# Patient Record
Sex: Male | Born: 1960 | Race: White | Hispanic: No | Marital: Married | Smoking: Former smoker
Health system: Southern US, Community
[De-identification: ages and names within clinical notes are randomized; demographics above are authoritative.]

## PROBLEM LIST (undated history)

## (undated) DIAGNOSIS — N200 Calculus of kidney: Secondary | ICD-10-CM

## (undated) DIAGNOSIS — I7121 Aneurysm of the ascending aorta, without rupture: Secondary | ICD-10-CM

## (undated) DIAGNOSIS — R12 Heartburn: Secondary | ICD-10-CM

## (undated) DIAGNOSIS — I10 Essential (primary) hypertension: Secondary | ICD-10-CM

## (undated) DIAGNOSIS — R7989 Other specified abnormal findings of blood chemistry: Secondary | ICD-10-CM

## (undated) DIAGNOSIS — I719 Aortic aneurysm of unspecified site, without rupture: Secondary | ICD-10-CM

## (undated) DIAGNOSIS — T7840XA Allergy, unspecified, initial encounter: Secondary | ICD-10-CM

## (undated) DIAGNOSIS — U071 COVID-19: Secondary | ICD-10-CM

## (undated) DIAGNOSIS — G4730 Sleep apnea, unspecified: Secondary | ICD-10-CM

## (undated) DIAGNOSIS — G4733 Obstructive sleep apnea (adult) (pediatric): Secondary | ICD-10-CM

## (undated) HISTORY — DX: Sleep apnea, unspecified: G47.30

## (undated) HISTORY — DX: Other specified abnormal findings of blood chemistry: R79.89

## (undated) HISTORY — DX: Obstructive sleep apnea (adult) (pediatric): G47.33

## (undated) HISTORY — DX: Heartburn: R12

## (undated) HISTORY — DX: Calculus of kidney: N20.0

## (undated) HISTORY — DX: Allergy, unspecified, initial encounter: T78.40XA

## (undated) HISTORY — DX: COVID-19: U07.1

## (undated) HISTORY — DX: Essential (primary) hypertension: I10

---

## 2001-02-12 ENCOUNTER — Ambulatory Visit (HOSPITAL_COMMUNITY): Admission: RE | Admit: 2001-02-12 | Discharge: 2001-02-12 | Payer: Self-pay | Admitting: *Deleted

## 2006-09-24 ENCOUNTER — Emergency Department (HOSPITAL_COMMUNITY): Admission: EM | Admit: 2006-09-24 | Discharge: 2006-09-24 | Payer: Self-pay | Admitting: Emergency Medicine

## 2008-02-04 ENCOUNTER — Encounter (INDEPENDENT_AMBULATORY_CARE_PROVIDER_SITE_OTHER): Payer: Self-pay | Admitting: *Deleted

## 2008-02-15 ENCOUNTER — Emergency Department (HOSPITAL_COMMUNITY): Admission: EM | Admit: 2008-02-15 | Discharge: 2008-02-15 | Payer: Self-pay | Admitting: Emergency Medicine

## 2008-09-09 ENCOUNTER — Encounter: Payer: Self-pay | Admitting: Family Medicine

## 2008-09-27 ENCOUNTER — Emergency Department (HOSPITAL_COMMUNITY): Admission: EM | Admit: 2008-09-27 | Discharge: 2008-09-27 | Payer: Self-pay | Admitting: Family Medicine

## 2008-09-27 ENCOUNTER — Inpatient Hospital Stay (HOSPITAL_COMMUNITY): Admission: EM | Admit: 2008-09-27 | Discharge: 2008-09-28 | Payer: Self-pay | Admitting: Emergency Medicine

## 2009-01-23 ENCOUNTER — Ambulatory Visit: Payer: Self-pay | Admitting: Family Medicine

## 2009-01-23 DIAGNOSIS — Z87891 Personal history of nicotine dependence: Secondary | ICD-10-CM | POA: Insufficient documentation

## 2009-01-23 DIAGNOSIS — N200 Calculus of kidney: Secondary | ICD-10-CM | POA: Insufficient documentation

## 2009-01-23 DIAGNOSIS — L851 Acquired keratosis [keratoderma] palmaris et plantaris: Secondary | ICD-10-CM | POA: Insufficient documentation

## 2009-02-09 ENCOUNTER — Emergency Department (HOSPITAL_COMMUNITY): Admission: EM | Admit: 2009-02-09 | Discharge: 2009-02-09 | Payer: Self-pay | Admitting: Family Medicine

## 2009-02-14 ENCOUNTER — Emergency Department (HOSPITAL_COMMUNITY): Admission: EM | Admit: 2009-02-14 | Discharge: 2009-02-14 | Payer: Self-pay | Admitting: Family Medicine

## 2009-05-01 ENCOUNTER — Emergency Department (HOSPITAL_COMMUNITY): Admission: EM | Admit: 2009-05-01 | Discharge: 2009-05-01 | Payer: Self-pay | Admitting: Family Medicine

## 2009-08-16 ENCOUNTER — Encounter (INDEPENDENT_AMBULATORY_CARE_PROVIDER_SITE_OTHER): Payer: Self-pay | Admitting: *Deleted

## 2009-08-29 ENCOUNTER — Encounter (INDEPENDENT_AMBULATORY_CARE_PROVIDER_SITE_OTHER): Payer: Self-pay | Admitting: *Deleted

## 2009-08-30 ENCOUNTER — Ambulatory Visit: Payer: Self-pay | Admitting: Gastroenterology

## 2009-09-18 ENCOUNTER — Ambulatory Visit: Payer: Self-pay | Admitting: Gastroenterology

## 2009-09-18 HISTORY — PX: COLONOSCOPY: SHX174

## 2009-09-18 LAB — HM COLONOSCOPY

## 2009-11-09 ENCOUNTER — Ambulatory Visit: Payer: Self-pay | Admitting: Family Medicine

## 2009-11-09 LAB — CONVERTED CEMR LAB
ALT: 38 units/L (ref 0–53)
AST: 32 units/L (ref 0–37)
Albumin: 4 g/dL (ref 3.5–5.2)
Alkaline Phosphatase: 71 units/L (ref 39–117)
BUN: 11 mg/dL (ref 6–23)
Basophils Absolute: 0.1 10*3/uL (ref 0.0–0.1)
Basophils Relative: 0.6 % (ref 0.0–3.0)
Bilirubin, Direct: 0.2 mg/dL (ref 0.0–0.3)
CO2: 29 meq/L (ref 19–32)
Calcium: 9.9 mg/dL (ref 8.4–10.5)
Chloride: 103 meq/L (ref 96–112)
Cholesterol: 186 mg/dL (ref 0–200)
Creatinine, Ser: 1.1 mg/dL (ref 0.4–1.5)
Eosinophils Absolute: 0.4 10*3/uL (ref 0.0–0.7)
Eosinophils Relative: 4.3 % (ref 0.0–5.0)
GFR calc non Af Amer: 77.64 mL/min (ref >60)
Glucose, Bld: 67 mg/dL — ABNORMAL LOW (ref 70–99)
HCT: 43.9 % (ref 39.0–52.0)
HDL: 36.2 mg/dL — ABNORMAL LOW (ref >39.00)
Hemoglobin: 14.5 g/dL (ref 13.0–17.0)
LDL Cholesterol: 134 mg/dL — ABNORMAL HIGH (ref 0–99)
Lymphocytes Relative: 30.6 % (ref 12.0–46.0)
Lymphs Abs: 2.9 10*3/uL (ref 0.7–4.0)
MCHC: 33.1 g/dL (ref 30.0–36.0)
MCV: 77.6 fL — ABNORMAL LOW (ref 78.0–100.0)
Monocytes Absolute: 0.6 10*3/uL (ref 0.1–1.0)
Monocytes Relative: 6.3 % (ref 3.0–12.0)
Neutro Abs: 5.5 10*3/uL (ref 1.4–7.7)
Neutrophils Relative %: 58.2 % (ref 43.0–77.0)
PSA: 0.24 ng/mL (ref 0.10–4.00)
Platelets: 233 10*3/uL (ref 150.0–400.0)
Potassium: 4.3 meq/L (ref 3.5–5.1)
RBC: 5.66 M/uL (ref 4.22–5.81)
RDW: 16.4 % — ABNORMAL HIGH (ref 11.5–14.6)
Sodium: 139 meq/L (ref 135–145)
TSH: 2.17 microintl units/mL (ref 0.35–5.50)
Total Bilirubin: 1.2 mg/dL (ref 0.3–1.2)
Total CHOL/HDL Ratio: 5
Total Protein: 6.6 g/dL (ref 6.0–8.3)
Triglycerides: 79 mg/dL (ref 0.0–149.0)
VLDL: 15.8 mg/dL (ref 0.0–40.0)
WBC: 9.4 10*3/uL (ref 4.5–10.5)

## 2009-11-13 ENCOUNTER — Ambulatory Visit: Payer: Self-pay | Admitting: Family Medicine

## 2009-11-14 ENCOUNTER — Ambulatory Visit: Payer: Self-pay | Admitting: Family Medicine

## 2009-11-16 ENCOUNTER — Ambulatory Visit: Payer: Self-pay | Admitting: Family Medicine

## 2009-11-17 ENCOUNTER — Telehealth: Payer: Self-pay | Admitting: Family Medicine

## 2009-11-24 ENCOUNTER — Telehealth: Payer: Self-pay | Admitting: Family Medicine

## 2010-02-16 ENCOUNTER — Emergency Department (HOSPITAL_COMMUNITY): Admission: EM | Admit: 2010-02-16 | Discharge: 2010-02-16 | Payer: Self-pay | Admitting: Family Medicine

## 2010-04-26 NOTE — Progress Notes (Signed)
Summary: note & be sure no more med needed  Phone Note Call from Patient Call back at [phone]   Summary of Call: 1)Note needed Mon through Fri Aug 29 to Sept 2.  Will return to work 9-6. Fax note to [phone] Attn Haynes Dage.   2) Sores starting to dry up.  2weeks duration.  Want to double check that you don't want another round antibiotic.  Dr. B did tell me that med works 5 days beyond end of taking it.   Frustrating that sore not cleared up.  [facility] Initial call taken by: Rudy Jew, RN,  November 24, 2009 8:50 AM  Follow-up for Phone Call        OK to provide note as requested .  At this point no further antibiotics and I have discussed this with pt.  He will call if he feels he needs any further antibiotics by next week. Follow-up by: Evelena Peat MD,  November 24, 2009 8:59 AM  Additional Follow-up for Phone Call Additional follow up Details #1::        Phone Call Completed Additional Follow-up by: Rudy Jew, RN,  November 24, 2009 10:42 AM

## 2010-04-26 NOTE — Assessment & Plan Note (Signed)
Summary: CPX//CCM   Vital Signs:  Patient profile:   50 year old male Height:      69 inches Weight:      282 pounds BMI:     41.79 Temp:     98 degrees F oral Pulse rate:   72 / minute Pulse rhythm:   regular Resp:     12 per minute BP sitting:   140 / 90  (left arm) Cuff size:   large  Vitals Entered By: Sid Falcon LPN (November 16, 2009 10:21 AM)  Nutrition Counseling: Patient's BMI is greater than 25 and therefore counseled on weight management options. CC: CPX, labs done   History of Present Illness: Here for CPE.  Colonocsopy this summer normal. Still smoking but ready to quit. Not exercising.  Recent cough and wheeze improving with prednisone and antibiotics. Does have penile vesicluar rash following recent Doxycycline. No dysuria.  Preventive Screening-Counseling & Management  Alcohol-Tobacco     Smoking Status: current  Clinical Review Panels:  Prevention   Last Colonoscopy:  DONE (09/18/2009)   Last PSA:  0.24 (11/09/2009)  Lipid Management   Cholesterol:  186 (11/09/2009)   LDL (bad choesterol):  134 (11/09/2009)   HDL (good cholesterol):  36.20 (11/09/2009)  CBC   WBC:  9.4 (11/09/2009)   RBC:  5.66 (11/09/2009)   Hgb:  14.5 (11/09/2009)   Hct:  43.9 (11/09/2009)   Platelets:  233.0 (11/09/2009)   MCV  77.6 (11/09/2009)   MCHC  33.1 (11/09/2009)   RDW  16.4 (11/09/2009)   PMN:  58.2 (11/09/2009)   Lymphs:  30.6 (11/09/2009)   Monos:  6.3 (11/09/2009)   Eosinophils:  4.3 (11/09/2009)   Basophil:  0.6 (11/09/2009)  Complete Metabolic Panel   Glucose:  67 (11/09/2009)   Sodium:  139 (11/09/2009)   Potassium:  4.3 (11/09/2009)   Chloride:  103 (11/09/2009)   CO2:  29 (11/09/2009)   BUN:  11 (11/09/2009)   Creatinine:  1.1 (11/09/2009)   Albumin:  4.0 (11/09/2009)   Total Protein:  6.6 (11/09/2009)   Calcium:  9.9 (11/09/2009)   Total Bili:  1.2 (11/09/2009)   Alk Phos:  71 (11/09/2009)   SGPT (ALT):  38 (11/09/2009)   SGOT (AST):   32 (11/09/2009)   Allergies: 1)  Doxycycline Hyclate (Doxycycline Hyclate)  Past History:  Past Medical History: Last updated: 11/13/2009 Kidney stones X 2 2009, 2010 Obstructive Sleep Apnea.  Family History: Last updated: 01/23/2009 Family History Breast cancer 1st degree relative <50 Family history heart disease  Social History: Last updated: 01/23/2009 Occupation:   Drives concrete truck Married Current Smoker Alcohol use-no  Risk Factors: Smoking Status: current (11/16/2009) PMH-FH-SH reviewed for relevance  Review of Systems  The patient denies anorexia, fever, weight loss, vision loss, decreased hearing, hoarseness, chest pain, syncope, dyspnea on exertion, peripheral edema, prolonged cough, headaches, hemoptysis, abdominal pain, melena, hematochezia, severe indigestion/heartburn, hematuria, incontinence, muscle weakness, depression, and enlarged lymph nodes.    Physical Exam  General:  Well-developed,well-nourished,in no acute distress; alert,appropriate and cooperative throughout examination Head:  Normocephalic and atraumatic without obvious abnormalities. No apparent alopecia or balding. Eyes:  No corneal or conjunctival inflammation noted. EOMI. Perrla. Funduscopic exam benign, without hemorrhages, exudates or papilledema. Vision grossly normal. Ears:  External ear exam shows no significant lesions or deformities.  Otoscopic examination reveals clear canals, tympanic membranes are intact bilaterally without bulging, retraction, inflammation or discharge. Hearing is grossly normal bilaterally. Mouth:  Oral mucosa and oropharynx without lesions or exudates.  Teeth in good repair. Neck:  No deformities, masses, or tenderness noted. Lungs:  lungs are much clearer with resolution of wheezing from couple days ago. No rales Heart:  Normal rate and regular rhythm. S1 and S2 normal without gallop, murmur, click, rub or other extra sounds. Abdomen:  Bowel sounds  positive,abdomen soft and non-tender without masses, organomegaly or hernias noted. Genitalia:  erythema shaft and foreskin of penis.  HAs some large vesicles which are drying.  These are diffuse-almost more bullous and not typical of herpetic lesions.  No penile discharge. Extremities:  No clubbing, cyanosis, edema, or deformity noted with normal full range of motion of all joints.   Neurologic:  alert & oriented X3, cranial nerves II-XII intact, strength normal in all extremities, and gait normal.   Skin:  patient has several blistery rash involving shaft of penis. No pustules. Cervical Nodes:  No lymphadenopathy noted Psych:  normally interactive, good eye contact, not anxious appearing, and not depressed appearing.     Impression & Recommendations:  Problem # 1:  Preventive Health Care (ICD-V70.0) discussed smoking cessation,  weight loss. Tdap.  Labs reviewed.  Complete Medication List: 1)  Azithromycin 250 Mg Tabs (Azithromycin) .... 2 by mouth today then one by mouth once daily for 4 days. 2)  Ventolin Hfa 108 (90 Base) Mcg/act Aers (Albuterol sulfate) .... 2 puffs every 4 hours as needed cough and wheeze 3)  Prednisone 10 Mg Tabs (Prednisone) .... Taper as follows: 5-5-4-4-3-3-2-1  Other Orders: Tobacco use cessation intermediate 3-10 minutes (99406) Tdap => 58yrs IM (40981) Admin 1st Vaccine (19147)  Patient Instructions: 1)  Stop smoking tips: Choose a quit date. Cut down before the quit date. Decide what you will do as a substitute when you feel the urge to smoke(gum, toothpick, exercise).  2)  It is important that you exercise reguarly at least 20 minutes 5 times a week. If you develop chest pain, have severe difficulty breathing, or feel very tired, stop exercising immediately and seek medical attention.  3)  You need to lose weight. Consider a lower calorie diet and regular exercise.  4)  Check your  Blood Pressure regularly . If it is above:140/90   you should make an  appointment. 5)  Please schedule a follow-up appointment in 4 months .    Immunizations Administered:  Tetanus Vaccine:    Vaccine Type: Tdap    Site: left deltoid    Mfr: GlaxoSmithKline    Dose: 0.5 ml    Route: IM    Given by: Sid Falcon LPN    Exp. Date: 12/14/2011    Lot #: [id number]

## 2010-04-26 NOTE — Procedures (Signed)
Summary: Colonoscopy  Patient: [name] Note: All result statuses are Final unless otherwise noted.  Tests: (1) Colonoscopy (COL)   COL Colonoscopy           DONE     [facility]     [address]     [place], [place]  [address]           COLONOSCOPY PROCEDURE REPORT           PATIENT:  Antonio Watts, Antonio Watts  MR#:  [id number]     BIRTHDATE:  1960-10-06, 49 yrs. old  GENDER:  male     ENDOSCOPIST:  Judie Petit T. Russella Dar, MD, [facility]     Referred by:  Evelena Peat, M.D.     PROCEDURE DATE:  09/18/2009     PROCEDURE:  Colonoscopy 30160     ASA CLASS:  Class II     INDICATIONS:  1) Routine Risk Screening     MEDICATIONS:   Fentanyl 50 mcg IV, Versed 8 mg IV     DESCRIPTION OF PROCEDURE:   After the risks benefits and     alternatives of the procedure were thoroughly explained, informed     consent was obtained.  Digital rectal exam was performed and     revealed no abnormalities.   The LB [id number] endoscope     was introduced through the anus and advanced to the cecum, which     was identified by both the appendix and ileocecal valve, without     limitations.  The quality of the prep was good, using MoviPrep.     The instrument was then slowly withdrawn as the colon was fully     examined.     <<PROCEDUREIMAGES>>     FINDINGS:  A normal appearing cecum, ileocecal valve, and     appendiceal orifice were identified. The ascending, hepatic     flexure, transverse, splenic flexure, descending, sigmoid colon,     and rectum appeared unremarkable. Retroflexed views in the rectum     revealed no abnormalities.  The time to cecum =  3  minutes. The     scope was then withdrawn (time =  12.33  min) from the patient and     the procedure completed.           COMPLICATIONS:  None           ENDOSCOPIC IMPRESSION:     1) Normal colon           RECOMMENDATIONS:     1) Continue current colorectal screening recommendations for     "routine risk" patients with a repeat  colonoscopy in 10 years.           Venita Lick. Russella Dar, MD, [facility]           n.     eSIGNED:   Venita Lick. Stark at 09/18/2009 04:12 PM           [name], [id number]  Note: An exclamation mark (!) indicates a result that was not dispersed into the flowsheet. Document Creation Date: 09/18/2009 4:13 PM _______________________________________________________________________  (1) Order result status: Final Collection or observation date-time: 09/18/2009 16:10 Requested date-time:  Receipt date-time:  Reported date-time:  Referring Physician:   Ordering Physician: Claudette Head [phone]) Specimen Source:  Source: Launa Grill Order Number: [id number] Lab site:   Appended Document: Colonoscopy     Procedures Next Due Date:    Colonoscopy: 09/2019

## 2010-04-26 NOTE — Letter (Signed)
Summary: Previsit letter  [facility]  [address] [place], [place] [address]   Phone: [phone]  Fax: [phone]       08/16/2009 MRN: [id number]  Antonio Watts [address] [place], [place]  [address]  Dear Mr. ROLLO,  Welcome to the Gastroenterology Division at [facility].    You are scheduled to see a nurse for your pre-procedure visit on 08/30/2009 at 4:30PM on the 3rd floor at [facility], [address]  We ask that you try to arrive at our office 15 minutes prior to your appointment time to allow for check-in.  Your nurse visit will consist of discussing your medical and surgical history, your immediate family medical history, and your medications.    Please bring a complete list of all your medications or, if you prefer, bring the medication bottles and we will list them.  We will need to be aware of both prescribed and over the counter drugs.  We will need to know exact dosage information as well.  If you are on blood thinners (Coumadin, Plavix, Aggrenox, Ticlid, etc.) please call our office today/prior to your appointment, as we need to consult with your physician about holding your medication.   Please be prepared to read and sign documents such as consent forms, a financial agreement, and acknowledgement forms.  If necessary, and with your consent, a friend or relative is welcome to sit-in on the nurse visit with you.  Please bring your insurance card so that we may make a copy of it.  If your insurance requires a referral to see a specialist, please bring your referral form from your primary care physician.  No co-pay is required for this nurse visit.     If you cannot keep your appointment, please call [phone] to cancel or reschedule prior to your appointment date.  This allows [place] the opportunity to schedule an appointment for another patient in need of care.    Thank you for choosing [facility] for your medical  needs.  We appreciate the opportunity to care for you.  Please visit [place] at our website  to learn more about our practice.                     Sincerely.                                                                                                                   The Gastroenterology Division

## 2010-04-26 NOTE — Assessment & Plan Note (Signed)
Summary: BRAND NEW PT/TO ESTABLISH/CJR   Vital Signs:  Patient profile:   50 year old male Height:      69 inches Weight:      271 pounds BMI:     40.16 Temp:     98.6 degrees F oral Pulse rate:   80 / minute Pulse rhythm:   regular Resp:     12 per minute BP sitting:   160 / 100  (left arm) Cuff size:   large  Vitals Entered By: Sid Falcon LPN (January 23, 2009 3:14 PM)  Nutrition Counseling: Patient's BMI is greater than 25 and therefore counseled on weight management options.  Serial Vital Signs/Assessments:  Time      Position  BP       Pulse  Resp  Temp     By                     130/90                         Evelena Peat MD  CC: New pt to establish, mole on left temple area changing   History of Present Illness: New patient to establish care. Past medical history significant for kidney stones back in 2000 and 2010. Takes no medications. No known allergies.  He has a verrucous, hyperkeratotic lesion left temple region which has been slowly growing for several years. This was biopsied 10-12 years ago and normal. No bleeding or itching. Sometimes gets in the way with combing hair.  Patient smokes one pack of cigarettes per day. Desires to quit at this time. Has had some success with nicotine patches in the past.  Contemplating methods of quitting at this time.  Preventive Screening-Counseling & Management  Alcohol-Tobacco     Smoking Status: current  Allergies (verified): No Known Drug Allergies  Past History:  Family History: Last updated: 01/23/2009 Family History Breast cancer 1st degree relative <50 Family history heart disease  Social History: Last updated: 01/23/2009 Occupation:   Drives concrete truck Married Current Smoker Alcohol use-no  Risk Factors: Smoking Status: current (01/23/2009)  Past Medical History: Kidney stones X 2 2009, 2010  Family History: Family History Breast cancer 1st degree relative <50 Family history heart  disease  Social History: Occupation:   Dietitian truck Married Current Smoker Alcohol use-no Occupation:  employed Smoking Status:  current  Review of Systems  The patient denies anorexia, fever, weight loss, chest pain, prolonged cough, and abdominal pain.    Physical Exam  General:  Well-developed,well-nourished,in no acute distress; alert,appropriate and cooperative throughout examination Ears:  External ear exam shows no significant lesions or deformities.  Otoscopic examination reveals clear canals, tympanic membranes are intact bilaterally without bulging, retraction, inflammation or discharge. Hearing is grossly normal bilaterally. Mouth:  Oral mucosa and oropharynx without lesions or exudates.  Teeth in good repair. Neck:  No deformities, masses, or tenderness noted. Lungs:  Normal respiratory effort, chest expands symmetrically. Lungs are clear to auscultation, no crackles or wheezes. Heart:  Normal rate and regular rhythm. S1 and S2 normal without gallop, murmur, click, rub or other extra sounds. Skin:  well-demarcated brownish slightly raised lesion left temporal region which is approximately 7-8 mm in diameter   Impression & Recommendations:  Problem # 1:  KERATOSIS (ICD-701.1)  Verrucous keratosis left temporal region. Reviewed options for treatment. We recommended liquid nitrogen after reviewing risks and benefits the patient consented. Patient tolerated well. Follow  up  2-3 weeks if this is not resolving.  Orders: Cryotherapy/Destruction benign or premalignant lesion (1st lesion)  (17000)  Problem # 2:  NEPHROLITHIASIS (ICD-592.0) Assessment: Comment Only  Problem # 3:  PERS HX TOBACCO USE PRESENTING HAZARDS HEALTH (ICD-V15.82) Discussed options for smoking cessation.  Pt undecided at this point.  Patient Instructions: 1)  Consider scheduling complete physical examination within the next year at your convenience. 2)  Be in touch within 3-4 weeks if skin  lesion is not resolving

## 2010-04-26 NOTE — Assessment & Plan Note (Signed)
Summary: Allergic reaction?/cb   Vital Signs:  Patient profile:   50 year old male Temp:     98.2 degrees F oral BP sitting:   130 / 82  (left arm) Cuff size:   large  Vitals Entered By: Sid Falcon LPN (November 14, 2009 9:45 AM)  History of Present Illness: Same-day appointment. Possible allergic reaction. Started doxycycline yesterday. Last night had some swelling and itching of the hands and feet. No lip or tongue edema. Breathing is actually improved with less wheezing and less shortness of breath. He received Depo-Medrol yesterday. Did not fill prescription for Ventolin. Benadryl for itching last night which helped slightly. Denies any significant dyspnea today  Did have fever up to 103 last night but down today after taking some aspirin. No prior allergic reactions to aspirin.  His rash preceeded ASA use.  Allergies (verified): 1)  Doxycycline Hyclate (Doxycycline Hyclate)  Past History:  Past Medical History: Last updated: 11/13/2009 Kidney stones X 2 2009, 2010 Obstructive Sleep Apnea. PMH reviewed for relevance  Physical Exam  General:  Well-developed,well-nourished,in no acute distress; alert,appropriate and cooperative throughout examination Mouth:  Oral mucosa and oropharynx without lesions or exudates.  Teeth in good repair. Neck:  No deformities, masses, or tenderness noted. Lungs:  still has some diffuse wheezes but overall improved air movement. No rales Heart:  normal rate and regular rhythm.   Skin:  patient has some mild swelling and erythema involving the hands diffusely mostly palms with similar though lesser involvement of the feet. No generalized hives. Patient also has some erythema and swelling of the foreskin and involving the shaft/glans of the penis   Impression & Recommendations:  Problem # 1:  SKIN RASH, ALLERGIC (ICD-692.9) Stop Doxycyxline.  Switch to Z-max.  Add oral pred taper and cont benadryl. His updated medication list for this problem  includes:    Prednisone 10 Mg Tabs (Prednisone) .Marland Kitchen... Taper as follows: 5-5-4-4-3-3-2-1  Complete Medication List: 1)  Azithromycin 250 Mg Tabs (Azithromycin) .... 2 by mouth today then one by mouth once daily for 4 days. 2)  Ventolin Hfa 108 (90 Base) Mcg/act Aers (Albuterol sulfate) .... 2 puffs every 4 hours as needed cough and wheeze 3)  Prednisone 10 Mg Tabs (Prednisone) .... Taper as follows: 5-5-4-4-3-3-2-1  Patient Instructions: 1)  Stop doxycycline 2)  Continue Benadryl as needed for itching Prescriptions: PREDNISONE 10 MG TABS (PREDNISONE) taper as follows: 5-5-4-4-3-3-2-1  #27 x 0   Entered and Authorized by:   Evelena Peat MD   Signed by:   Evelena Peat MD on 11/14/2009   Method used:   [facility]  [address] [id number]       [address]       [place], [place]  [address]       Ph: [phone] or [phone]       Fax: [phone]   RxID:   [id number] AZITHROMYCIN 250 MG TABS (AZITHROMYCIN) 2 by mouth today then one by mouth once daily for 4 days.  #6 x 0   Entered and Authorized by:   Evelena Peat MD   Signed by:   Evelena Peat MD on 11/14/2009   Method used:   [facility]        [facility]  [address] [id number]       [address]       [facility]  [place], [place]  [address]       Ph: [phone] or [phone]       Fax: [phone]   RxID:   [id number]

## 2010-04-26 NOTE — Assessment & Plan Note (Signed)
Summary: sinuses///ccm   Vital Signs:  Patient profile:   50 year old male O2 Sat:      97 % on Room air Temp:     99.2 degrees F oral BP sitting:   152 / 84  (left arm) Cuff size:   large  Vitals Entered By: Sid Falcon LPN (November 13, 2009 4:39 PM)  O2 Flow:  Room air CC: Sinus problems, chest discomfort, low grade fever   History of Present Illness: Same-day appointment. Onset 2 days ago of sinus congestion, cough which is occasionally productive and some dyspnea with activity. Possibly some wheezing off and on. No history of asthma. Does smoke. No hemoptysis. Denies any sore throat.  Past history of obstructive sleep apnea and uses CPAP.  Preventive Screening-Counseling & Management  Alcohol-Tobacco     Smoking Status: current  Allergies (verified): No Known Drug Allergies  Past History:  Social History: Last updated: 01/23/2009 Occupation:   Drives concrete truck Married Current Smoker Alcohol use-no  Past Medical History: Kidney stones X 2 2009, 2010 Obstructive Sleep Apnea.  Physical Exam  General:  Well-developed,well-nourished,in no acute distress; alert,appropriate and cooperative throughout examination Ears:  External ear exam shows no significant lesions or deformities.  Otoscopic examination reveals clear canals, tympanic membranes are intact bilaterally without bulging, retraction, inflammation or discharge. Hearing is grossly normal bilaterally. Mouth:  Oral mucosa and oropharynx without lesions or exudates.  Teeth in good repair. Neck:  No deformities, masses, or tenderness noted. Lungs:  patient has diffuse scattered wheezes. No rales. No retractions. No tachypnea. Heart:  Normal rate and regular rhythm. S1 and S2 normal without gallop, murmur, click, rub or other extra sounds. Skin:  no rashes.     Impression & Recommendations:  Problem # 1:  BRONCHITIS, ACUTE WITH MILD BRONCHOSPASM (ICD-466.0)  His updated medication list for this problem  includes:    Doxycycline Hyclate 100 Mg Caps (Doxycycline hyclate) ..... One by mouth two times a day for 7 days    Ventolin Hfa 108 (90 Base) Mcg/act Aers (Albuterol sulfate) .Marland Kitchen... 2 puffs every 4 hours as needed cough and wheeze  Orders: Depo- Medrol 80mg  (J1040) Admin of Therapeutic Inj  intramuscular or subcutaneous (96295)  Complete Medication List: 1)  Doxycycline Hyclate 100 Mg Caps (Doxycycline hyclate) .... One by mouth two times a day for 7 days 2)  Ventolin Hfa 108 (90 Base) Mcg/act Aers (Albuterol sulfate) .... 2 puffs every 4 hours as needed cough and wheeze  Patient Instructions: 1)  Follow up by next week if symptoms not improving. Prescriptions: VENTOLIN HFA 108 (90 BASE) MCG/ACT AERS (ALBUTEROL SULFATE) 2 puffs every 4 hours as needed cough and wheeze  #1 x 1   Entered and Authorized by:   Evelena Peat MD   Signed by:   Evelena Peat MD on 11/13/2009   Method used:   [facility]        [facility]  [address] [id number]       [address]       [place], [place]  [address]       Ph: [phone] or [phone]       Fax: [phone]   RxID:   [id number] DOXYCYCLINE HYCLATE 100 MG CAPS (DOXYCYCLINE HYCLATE) one by mouth two times a day for 7 days  #14 x 0   Entered and Authorized by:   Evelena Peat MD   Signed by:   Evelena Peat MD on 11/13/2009  Method used:   [facility]        [facility]  [address] [id number]       [address]       [place], [place]  [address]       Ph: [phone] or [phone]       Fax: [phone]   RxID:   [id number]    Medication Administration  Injection # 1:    Medication: Depo- Medrol 80mg     Diagnosis: BRONCHITIS, ACUTE WITH MILD BRONCHOSPASM (ICD-466.0)    Route: IM    Site: RUOQ gluteus    Exp Date: 07/12/2012    Lot #: [id number]    Mfr: Pharmacia    Patient tolerated injection without complications    Given by: Sid Falcon LPN (November 13, 2009  5:07 PM)  Orders Added: 1)  Est. Patient Level III [[address]] 2)  Depo- Medrol 80mg  [J1040] 3)  Admin of Therapeutic Inj  intramuscular or subcutaneous [03474]

## 2010-04-26 NOTE — Miscellaneous (Signed)
Summary: [facility] Previsit/prep  Clinical Lists Changes  Medications: Added new medication of MOVIPREP 100 GM  SOLR (PEG-KCL-NACL-NASULF-NA ASC-C) As per prep instructions. - Signed Rx of MOVIPREP 100 GM  SOLR (PEG-KCL-NACL-NASULF-NA ASC-C) As per prep instructions.;  #1 x 0;  Signed;  Entered by: Wyona Almas RN;  Authorized by: Meryl Dare MD [facility];  Method used: [facility]  [address] [id number] [address], [place], [place]  [address], Ph: [phone] or [phone], Fax: [phone] Observations: Added new observation of NKA: T (08/30/2009 15:59)    Prescriptions: MOVIPREP 100 GM  SOLR (PEG-KCL-NACL-NASULF-NA ASC-C) As per prep instructions.  #1 x 0   Entered by:   Wyona Almas RN   Authorized by:   Meryl Dare MD [facility]   Signed by:   Wyona Almas RN on 08/30/2009   Method used:   [facility]        [facility]  [address] [id number]       [address]       [place], [place]  [address]       Ph: [phone] or [phone]       Fax: [phone]   RxID:   [id number]

## 2010-04-26 NOTE — Letter (Signed)
Summary: [facility] Instructions  [facility]  [address] [place], [place] [address]   Phone: [phone]  Fax: [phone]       TRASE BUNDA    26-Sep-1960    MRN: [id number]        Procedure Day Antonio Watts:  Duanne Limerick  09/18/09     Arrival Time: 3:00pm     Procedure Time: 4:00pm     Location of Procedure:                    _X _  [facility] (4th Floor)                       PREPARATION FOR COLONOSCOPY WITH MOVIPREP   Starting 5 days prior to your procedure  [facility] 09/13/09  do not eat nuts, seeds, popcorn, corn, beans, peas,  salads, or any raw vegetables.  Do not take any fiber supplements (e.g. Metamucil, Citrucel, and Benefiber).  THE DAY BEFORE YOUR PROCEDURE         DATE:  SUNDAY  09/17/09  1.  Drink clear liquids the entire day-NO SOLID FOOD  2.  Do not drink anything colored red or purple.  Avoid juices with pulp.  No orange juice.  3.  Drink at least 64 oz. (8 glasses) of fluid/clear liquids during the day to prevent dehydration and help the prep work efficiently.  CLEAR LIQUIDS INCLUDE: Water Jello Ice Popsicles Tea (sugar ok, no milk/cream) Powdered fruit flavored drinks Coffee (sugar ok, no milk/cream) Gatorade Juice: apple, white grape, white cranberry  Lemonade Clear bullion, consomm, broth Carbonated beverages (any kind) Strained chicken noodle soup Hard Candy                             4.  In the morning, mix first dose of MoviPrep solution:    Empty 1 Pouch A and 1 Pouch B into the disposable container    Add lukewarm drinking water to the top line of the container. Mix to dissolve    Refrigerate (mixed solution should be used within 24 hrs)  5.  Begin drinking the prep at 5:00 p.m. The MoviPrep container is divided by 4 marks.   Every 15 minutes drink the solution down to the next mark (approximately 8 oz) until the full liter is complete.   6.  Follow completed prep with 16 oz of clear liquid of your choice  (Nothing red or purple).  Continue to drink clear liquids until bedtime.  7.  Before going to bed, mix second dose of MoviPrep solution:    Empty 1 Pouch A and 1 Pouch B into the disposable container    Add lukewarm drinking water to the top line of the container. Mix to dissolve    Refrigerate  THE DAY OF YOUR PROCEDURE      DATE:  MONDAY  09/18/09  Beginning at  11:00AM (5 hours before procedure):         1. Every 15 minutes, drink the solution down to the next mark (approx 8 oz) until the full liter is complete.  2. Follow completed prep with 16 oz. of clear liquid of your choice.    3. You may drink clear liquids until  2:00PM  (2 HOURS BEFORE PROCEDURE).   MEDICATION INSTRUCTIONS  Unless otherwise instructed, you should take regular prescription medications with a small sip of water   as early as  possible the morning of your procedure.        OTHER INSTRUCTIONS  You will need a responsible adult at least 51 years of age to accompany you and drive you home.   This person must remain in the waiting room during your procedure.  Wear loose fitting clothing that is easily removed.  Leave jewelry and other valuables at home.  However, you may wish to bring a book to read or  an iPod/MP3 player to listen to music as you wait for your procedure to start.  Remove all body piercing jewelry and leave at home.  Total time from sign-in until discharge is approximately 2-3 hours.  You should go home directly after your procedure and rest.  You can resume normal activities the  day after your procedure.  The day of your procedure you should not:   Drive   Make legal decisions   Operate machinery   Drink alcohol   Return to work  You will receive specific instructions about eating, activities and medications before you leave.    The above instructions have been reviewed and explained to me by   Wyona Almas RN  August 30, 2009 4:30 PM     I fully understand  and can verbalize these instructions _____________________________ Date _________

## 2010-04-26 NOTE — Progress Notes (Signed)
Summary: painful sores  Phone Note Call from Patient Call back at [phone] c   Summary of Call: Genital sores on testicles so sore, one more Tetracycline, Advil 3 3-4 x yesterday not touching pain, urination almost unbearable, maybe sore inside.  Some topical, a numbing cream, or something by mouth that would help.  Congestion chest is better.  [facility].  Reaction to Doxy this & swelling fingers.     Initial call taken by: Rudy Jew, RN,  November 17, 2009 9:13 AM  Follow-up for Phone Call        vicodin 5/500 mg 1-2 by mouth q 6 hours as needed pain #30 with no refill. Follow-up by: Evelena Peat MD,  November 17, 2009 11:15 AM  Additional Follow-up for Phone Call Additional follow up Details #1::        Phone Call Completed Additional Follow-up by: Rudy Jew, RN,  November 17, 2009 12:20 PM    New/Updated Medications: VICODIN 5-500 MG TABS (HYDROCODONE-ACETAMINOPHEN) 1-2 by mouth every 6 hours as needed pain Prescriptions: VICODIN 5-500 MG TABS (HYDROCODONE-ACETAMINOPHEN) 1-2 by mouth every 6 hours as needed pain  #30 x 0   Entered by:   Rudy Jew, RN   Authorized by:   Evelena Peat MD   Signed by:   Rudy Jew, RN on 11/17/2009   Method used:   [facility]phoned to ...       [facility]  [address] [id number]       [address]       [place], [place]  [address]       Ph: [phone] or [phone]       Fax: [phone]   RxID:   [id number]

## 2010-04-27 NOTE — Letter (Signed)
Summary: Records from [facility]   Records from [facility]   Imported By: Maryln Gottron 01/26/2009 13:53:12  _____________________________________________________________________  External Attachment:    Type:   Image     Comment:   External Document

## 2010-07-01 LAB — CBC
HCT: 39.9 % (ref 39.0–52.0)
HCT: 41.9 % (ref 39.0–52.0)
Hemoglobin: 13.1 g/dL (ref 13.0–17.0)
Hemoglobin: 13.8 g/dL (ref 13.0–17.0)
MCHC: 32.7 g/dL (ref 30.0–36.0)
MCHC: 32.8 g/dL (ref 30.0–36.0)
MCV: 77.1 fL — ABNORMAL LOW (ref 78.0–100.0)
MCV: 77.1 fL — ABNORMAL LOW (ref 78.0–100.0)
Platelets: 224 10*3/uL (ref 150–400)
Platelets: 229 10*3/uL (ref 150–400)
RBC: 5.18 MIL/uL (ref 4.22–5.81)
RBC: 5.44 MIL/uL (ref 4.22–5.81)
RDW: 15.9 % — ABNORMAL HIGH (ref 11.5–15.5)
RDW: 16.3 % — ABNORMAL HIGH (ref 11.5–15.5)
WBC: 7.8 10*3/uL (ref 4.0–10.5)
WBC: 8.6 10*3/uL (ref 4.0–10.5)

## 2010-07-01 LAB — HEPATIC FUNCTION PANEL
ALT: 29 U/L (ref 0–53)
AST: 28 U/L (ref 0–37)
Albumin: 3.3 g/dL — ABNORMAL LOW (ref 3.5–5.2)
Alkaline Phosphatase: 75 U/L (ref 39–117)
Bilirubin, Direct: 0.1 mg/dL (ref 0.0–0.3)
Indirect Bilirubin: 0.4 mg/dL (ref 0.3–0.9)
Total Bilirubin: 0.5 mg/dL (ref 0.3–1.2)
Total Protein: 6.5 g/dL (ref 6.0–8.3)

## 2010-07-01 LAB — BASIC METABOLIC PANEL
BUN: 10 mg/dL (ref 6–23)
BUN: 12 mg/dL (ref 6–23)
CO2: 28 mEq/L (ref 19–32)
CO2: 30 mEq/L (ref 19–32)
Calcium: 8.8 mg/dL (ref 8.4–10.5)
Calcium: 9.9 mg/dL (ref 8.4–10.5)
Chloride: 102 mEq/L (ref 96–112)
Chloride: 108 mEq/L (ref 96–112)
Creatinine, Ser: 1.09 mg/dL (ref 0.4–1.5)
Creatinine, Ser: 1.46 mg/dL (ref 0.4–1.5)
GFR calc Af Amer: >60 mL/min (ref >60)
GFR calc Af Amer: >60 mL/min (ref >60)
GFR calc non Af Amer: 51 mL/min — ABNORMAL LOW (ref >60)
GFR calc non Af Amer: >60 mL/min (ref >60)
Glucose, Bld: 100 mg/dL — ABNORMAL HIGH (ref 70–99)
Glucose, Bld: 124 mg/dL — ABNORMAL HIGH (ref 70–99)
Potassium: 4.1 mEq/L (ref 3.5–5.1)
Potassium: 4.4 mEq/L (ref 3.5–5.1)
Sodium: 136 mEq/L (ref 135–145)
Sodium: 140 mEq/L (ref 135–145)

## 2010-07-01 LAB — CK TOTAL AND CKMB (NOT AT ARMC)
CK, MB: 1.4 ng/mL (ref 0.3–4.0)
CK, MB: 1.9 ng/mL (ref 0.3–4.0)
Relative Index: 1.9 (ref 0.0–2.5)
Relative Index: INVALID (ref 0.0–2.5)
Total CK: 102 U/L (ref 7–232)
Total CK: 96 U/L (ref 7–232)

## 2010-07-01 LAB — TSH: TSH: 0.961 u[IU]/mL (ref 0.350–4.500)

## 2010-07-01 LAB — DIFFERENTIAL
Basophils Absolute: 0 10*3/uL (ref 0.0–0.1)
Basophils Relative: 0 % (ref 0–1)
Eosinophils Absolute: 0.5 10*3/uL (ref 0.0–0.7)
Eosinophils Relative: 6 % — ABNORMAL HIGH (ref 0–5)
Lymphocytes Relative: 26 % (ref 12–46)
Lymphs Abs: 2 10*3/uL (ref 0.7–4.0)
Monocytes Absolute: 0.6 10*3/uL (ref 0.1–1.0)
Monocytes Relative: 7 % (ref 3–12)
Neutro Abs: 4.7 10*3/uL (ref 1.7–7.7)
Neutrophils Relative %: 60 % (ref 43–77)

## 2010-07-01 LAB — URINALYSIS, ROUTINE W REFLEX MICROSCOPIC
Bilirubin Urine: NEGATIVE
Glucose, UA: NEGATIVE mg/dL
Hgb urine dipstick: NEGATIVE
Ketones, ur: NEGATIVE mg/dL
Nitrite: NEGATIVE
Protein, ur: NEGATIVE mg/dL
Specific Gravity, Urine: 1.025 (ref 1.005–1.030)
Urobilinogen, UA: 0.2 mg/dL (ref 0.0–1.0)
pH: 5.5 (ref 5.0–8.0)

## 2010-07-01 LAB — D-DIMER, QUANTITATIVE: D-Dimer, Quant: <0.22 ug/mL-FEU (ref 0.00–0.48)

## 2010-07-01 LAB — CARDIAC PANEL(CRET KIN+CKTOT+MB+TROPI)
CK, MB: 1.7 ng/mL (ref 0.3–4.0)
Relative Index: INVALID (ref 0.0–2.5)
Total CK: 82 U/L (ref 7–232)
Troponin I: 0.01 ng/mL (ref 0.00–0.06)

## 2010-07-01 LAB — POCT CARDIAC MARKERS
CKMB, poc: <1 ng/mL — ABNORMAL LOW (ref 1.0–8.0)
Myoglobin, poc: 71 ng/mL (ref 12–200)
Troponin i, poc: <0.05 ng/mL (ref 0.00–0.09)

## 2010-07-01 LAB — PROTIME-INR
INR: 1 (ref 0.00–1.49)
Prothrombin Time: 13.4 seconds (ref 11.6–15.2)

## 2010-07-01 LAB — BRAIN NATRIURETIC PEPTIDE: Pro B Natriuretic peptide (BNP): <30 pg/mL (ref 0.0–100.0)

## 2010-07-01 LAB — HEMOGLOBIN A1C
Hgb A1c MFr Bld: 5.6 % (ref 4.6–6.1)
Mean Plasma Glucose: 114 mg/dL

## 2010-07-01 LAB — TROPONIN I
Troponin I: 0.01 ng/mL (ref 0.00–0.06)
Troponin I: 0.02 ng/mL (ref 0.00–0.06)

## 2010-07-01 LAB — MAGNESIUM: Magnesium: 2.3 mg/dL (ref 1.5–2.5)

## 2010-07-01 LAB — APTT: aPTT: 29 seconds (ref 24–37)

## 2010-08-07 NOTE — Discharge Summary (Signed)
NAMEKAYIN, OSMENT NO.:  [id number]   MEDICAL RECORD NO.:  [id number]          PATIENT TYPE:  INP   LOCATION:  2503                         FACILITY:  [facility]   PHYSICIAN:  Nanetta Batty, M.D.   DATE OF BIRTH:  Jan 12, 1961   DATE OF ADMISSION:  09/27/2008  DATE OF DISCHARGE:  09/28/2008                               DISCHARGE SUMMARY   DISCHARGE DIAGNOSES:  1. Chest pain, worrisome for unstable angina, catheterization showing      normal coronaries and normal left ventricular function.  2. History of previous normal catheterization in 2000.  3. Probable sleep apnea.  4. History of smoking.  5. Stress and anxiety.   HOSPITAL COURSE:  The patient is a 50 year old male with a history of  chest pain.  He says he had a catheterization after an abnormal stress  test in 2000.  Reportedly, this was normal.  He denies any other serious  medical problems.  He was admitted on September 27, 2008 with chest pain and  left arm pain, worrisome for unstable angina.  His wife also stated the  patient had recently resumed smoking.  He was admitted to telemetry and  set up for diagnostic catheterization which was done same day by Dr.  Allyson Sabal.  Catheterization revealed normal coronaries and normal LV  function.  His wife also stated that the patient has a history of  irregular sleep patterns and snoring and we suspect he has sleep apnea.  His enzymes were negative.  His TSH is normal.  His hemoglobin A1c is  normal.  The next morning when seen on rounds, the patient admitted that  he has been under a great deal of stress.  Apparently, he has recently  lost a job and his income was severely affected.  He now thinks that his  symptoms were secondary to anxiety.  Dr. Tresa Endo saw him on rounds on September 28, 2008 and we feel like he could be discharged.  We will send him home  on some Xanax p.r.n.  He will also be instructed to take omeprazole over  the counter.  The patient would like to be set  up with a primary care  doctor and we can arrange this as an outpatient.  We will also arrange  for a sleep study.   DISCHARGE LABORATORY DATA:  Hemoglobin 13.1, hematocrit 39.1, platelets  229, and white count 8.6.  Sodium 134, potassium 4.1, BUN 12, and  creatinine 1.46.  Hemoglobin A1c is 5.6.  TSH 0.96.  EKG shows sinus  rhythm without acute changes.   DISPOSITION:  The patient is discharged in stable condition on Xanax  p.r.n. and Prilosec.  He will follow up with Dr. Allyson Sabal in a couple of  weeks.  He has been scheduled for an outpatient sleep study.  He will  need to be set up with the primary care doctor, Dr. Allyson Sabal could do this  when he sees him in followup.      Abelino Derrick, P.A.      Nanetta Batty, M.D.  Electronically Signed    LKK/MEDQ  D:  09/28/2008  T:  09/29/2008  Job:  [id number]

## 2010-08-07 NOTE — Cardiovascular Report (Signed)
NAME:  Antonio Watts, Antonio Watts NO.:  [id number]   MEDICAL RECORD NO.:  [id number]           PATIENT TYPE:   LOCATION:                                 FACILITY:   PHYSICIAN:  Nanetta Batty, M.D.   DATE OF BIRTH:  1960-04-11   DATE OF PROCEDURE:  DATE OF DISCHARGE:                            CARDIAC CATHETERIZATION   Antonio Watts is a 50 year old mildly overweight married white male, who  presented to the ER today with complaints of progressive shortness of  breath and substernal chest pain radiating to his left upper extremity.  His risk factors include tobacco abuse but otherwise negative.  He is on  no medications at home other than Synthroid.  He did have a cath  approximately 10 years ago that was normal according to the patient, but  he has not seen anybody since.  He ruled out for myocardial infarction.  His EKG showed no acute changes.  He presents now for diagnostic cath to  risk stratify and rule out ischemic etiology.   PROCEDURE DESCRIPTION:  The patient was brought to the Second Floor  [facility] in the postabsorptive state.  He was  premedicated with Darvocet in the emergency room.  His right groin was  prepped and shaved in the usual sterile fashion.  A 1% Xylocaine was  used for local anesthesia.  A 6-French sheath was inserted into the  right femoral artery using standard Seldinger technique.  A 6-French  right and left Judkins diagnostic catheters, as well as 6-French pigtail  catheter were used for selective coronary angiography and left  ventriculography respectively.  Visipaque dye was used for the entirety  of the case.  Retrograde aortic, left ventricular pullback pressures  were recorded.   HEMODYNAMICS:  1. Aortic systolic pressure 125, diastolic pressure 85.  2. Left ventricle systolic pressure 113, end-diastolic pressure 16.      There was no obvious pullback on gradient noted, but there was      respiratory variation in his  blood pressure.   Selective coronary angiography:  1. Left main normal.  2. LAD normal.  3. Left circumflex normal.  4. Right coronary artery was dominant and normal.  It should be noted      that there was fairly slow filling of his coronaries suggesting      endothelial dysfunction.   Left ventriculography; RAO left ventriculogram was performed using 25 mL  of Visipaque dye at 12 mL per second.  The overall LVEF was estimated at  approximately 50-55% without focal wall motion abnormalities.   IMPRESSION:  Mr. Champeau has essentially normal coronary arteries and  normal left ventricular function.  I am unsure of the etiology for his  chest pain, but I do not think it is cardiac in nature.  He will be  treated empirically with  antireflux measures.  Sheath was removed, and pressure was applied on  the groin to achieve hemostasis.  The patient left the lab in stable  condition.  The patient will also need to be evaluated as an outpatient  for obstructive sleep apnea.  Chronic risk factor modification will be  pursued including smoking cessation.      Nanetta Batty, M.D.  Electronically Signed     JB/MEDQ  D:  09/27/2008  T:  09/28/2008  Job:  [id number]   cc:   [facility]  [facility]

## 2010-09-27 ENCOUNTER — Encounter: Payer: Self-pay | Admitting: Family Medicine

## 2010-09-29 ENCOUNTER — Encounter: Payer: Self-pay | Admitting: Family Medicine

## 2010-09-29 ENCOUNTER — Ambulatory Visit (INDEPENDENT_AMBULATORY_CARE_PROVIDER_SITE_OTHER): Payer: BC Managed Care – PPO | Admitting: Family Medicine

## 2010-09-29 VITALS — BP 124/86 | HR 68 | Temp 98.5°F | Ht 69.0 in | Wt 293.0 lb

## 2010-09-29 DIAGNOSIS — L0390 Cellulitis, unspecified: Secondary | ICD-10-CM

## 2010-09-29 DIAGNOSIS — L0291 Cutaneous abscess, unspecified: Secondary | ICD-10-CM | POA: Insufficient documentation

## 2010-09-29 DIAGNOSIS — L02419 Cutaneous abscess of limb, unspecified: Secondary | ICD-10-CM

## 2010-09-29 MED ORDER — SULFAMETHOXAZOLE-TRIMETHOPRIM 800-160 MG PO TABS: 1.0000 | ORAL_TABLET | Freq: Two times a day (BID) | ORAL | Status: AC

## 2010-09-29 NOTE — Patient Instructions (Signed)
Abscess/Boil (Furuncle) An abscess (boil or furuncle) is an infected area that contains a collection of pus.  SYMPTOMS Signs and symptoms of an abscess include pain, tenderness, redness, or hardness. You may feel a moveable soft area under your skin. An abscess can occur anywhere in the body.  TREATMENT An incision (cut by the caregiver) may have been made over your abscess so the pus could be drained out. Gauze may have been packed into the space or a drain may have been looped thru the abscess cavity (pocket). This provides a drain that will allow the cavity to heal from the inside outwards. The abscess may be painful for a few days, but should feel much better if it was drained. Your abscess, if seen early, may not have localized and may not have been drained. If not, another appointment may be required if it does not get better on its own or with medications. HOME CARE INSTRUCTIONS  Only take over-the-counter or prescription medicines for pain, discomfort, or fever as directed by your caregiver.   Keep the skin and clothes clean around your abscess.   If the abscess was drained, you will need to use gauze dressing ("4x4") to collect any draining pus. These dressing typically will need to be changed 3 or more times during the day.   The infection may spread by skin contact with others. Avoid skin contact as much as possible.   Please take your bactrim ds- 1 tablet twice daily for 10 days and keep your appointment with Dr. Caryl Never on Monday.  Good hygiene is very important including regular hand washing, cover any draining skin lesions, and don't share personal care items.   If you participate in sports do not share athletic equipment, towels, whirlpools, or personal care items. Shower after every practice or tournament.   If a draining area cannot be adequately covered:   Do not participate in sports   Children should not participate in day care until the wound has healed or drainage  stops.   If your caregiver has given you a follow-up appointment, it is very important to keep that appointment. Not keeping the appointment could result in a much worse infection, chronic or permanent injury, pain, and disability. If there is any problem keeping the appointment, you must call back to this facility for assistance.  SEEK MEDICAL CARE IF:  You develop increased pain, swelling, redness, drainage, or bleeding in the wound site.   You develop signs of generalized infection including muscle aches, chills, fever, or a general ill feeling.   You or your child has an oral temperature above 100.4.   Your baby is older than 3 months with a rectal temperature of 100.5 F (38.1 C) or higher for more than 1 day.  See your caregiver as directed for a recheck or sooner if you develop any of the symptoms described above. Take antibiotics (medicine that kills germs) as directed if they were prescribed. MAKE SURE YOU:   Understand these instructions.   Will watch your condition.   Will get help right away if you are not doing well or get worse.  Document Released: 12/19/2004 Document Re-Released: 08/29/2009 [facility] Patient Information 2011 [place], [place].

## 2010-09-29 NOTE — Progress Notes (Signed)
  Subjective:    Patient ID: Antonio Watts, male    DOB: 02-Mar-1961, 50 y.o.   MRN: [id number]  HPI 50 yo here for boil on leg.  Noticed a small pimple on right upper thigh two days ago. Yesterday, became painful, firm, with surrounding erythema and warmth. Using warm compresses, no drainage. No fevers, chills, nausea or vomiting.     Patient Active Problem List  Diagnoses  . NEPHROLITHIASIS  . KERATOSIS  . PERS HX TOBACCO USE PRESENTING HAZARDS HEALTH   Past Medical History  Diagnosis Date  . Kidney stones   . OSA (obstructive sleep apnea)    No past surgical history on file. History  Substance Use Topics  . Smoking status: Current Everyday Smoker  . Smokeless tobacco: Never Used  . Alcohol Use: No   Family History  Problem Relation Age of Onset  . Cancer      fhx  . Heart disease      fhx   Allergies  Allergen Reactions  . Doxycycline Hyclate     REACTION: rash, swelling   Current Outpatient Prescriptions on File Prior to Visit  Medication Sig Dispense Refill  . albuterol (VENTOLIN HFA) 108 (90 BASE) MCG/ACT inhaler Inhale 2 puffs into the lungs every 6 (six) hours as needed.        Marland Kitchen DISCONTD: HYDROcodone-acetaminophen (VICODIN) 5-500 MG per tablet Take 1 tablet by mouth every 6 (six) hours as needed.         The PMH, PSH, Social History, Family History, Medications, and allergies have been reviewed in [facility], and have been updated if relevant.   Review of Systems See  HPI    Objective:   Physical Exam BP 124/86  Pulse 68  Temp(Src) 98.5 F (36.9 C) (Oral)  Ht 5\' 9"  (1.753 m)  Wt 293 lb (132.904 kg)  BMI 43.27 kg/m2    Gen:  Alert, pleasant, NAD VSS Skin:  2 cm, firm, non indurated, erythematous boil on upper right thigh, small area of surrounding warmth and erythema. Psych:  Good eye contact, not anxious or depressed appearing.    Assessment & Plan:   1. Cellulitis and abscess    New. Non indurated. Will treat with Bactrim DS bid x 10  days, continue warm compresses. Pt has appt scheduled with PCP on Monday. See pt instructions for details.

## 2010-10-01 ENCOUNTER — Ambulatory Visit (INDEPENDENT_AMBULATORY_CARE_PROVIDER_SITE_OTHER): Payer: BC Managed Care – PPO | Admitting: Family Medicine

## 2010-10-01 ENCOUNTER — Encounter: Payer: Self-pay | Admitting: Family Medicine

## 2010-10-01 VITALS — BP 110/82 | Temp 98.5°F | Wt 295.0 lb

## 2010-10-01 DIAGNOSIS — R42 Dizziness and giddiness: Secondary | ICD-10-CM

## 2010-10-01 DIAGNOSIS — L0291 Cutaneous abscess, unspecified: Secondary | ICD-10-CM

## 2010-10-01 DIAGNOSIS — L0390 Cellulitis, unspecified: Secondary | ICD-10-CM

## 2010-10-01 DIAGNOSIS — R209 Unspecified disturbances of skin sensation: Secondary | ICD-10-CM

## 2010-10-01 DIAGNOSIS — R208 Other disturbances of skin sensation: Secondary | ICD-10-CM

## 2010-10-01 LAB — GLUCOSE, POCT (MANUAL RESULT ENTRY): POC Glucose: 128

## 2010-10-01 MED ORDER — HYDROCODONE-ACETAMINOPHEN 5-500 MG PO CAPS: 1.0000 | ORAL_CAPSULE | Freq: Four times a day (QID) | ORAL | Status: DC | PRN

## 2010-10-01 NOTE — Patient Instructions (Signed)
Follow up promptly for any weakness, progressive numbness, or any new areas of numbness.

## 2010-10-01 NOTE — Progress Notes (Signed)
  Subjective:    Patient ID: Antonio Watts, male    DOB: [birth year], 50 y.o.   MRN: [id number]  HPI Patient seen for the following  Recent pimple involving the right upper thigh region. Seen Saturday clinic started Septra for possible MRSA. No known history of MRSA. Redness and swelling it actually improved. No fever or chills. Still has some associated pain. No drainage at this point. Nonfluctuant.  6 month history of some intermittent numbness right anterior thigh. Occurs mostly with standing. No associated back pain. No weakness. No other areas of dysesthesia. Occasional burning sensation right anterior thigh. No incontinence symptoms. No history of diabetes. Nonfasting blood sugar today 128   Review of Systems  Constitutional: Negative for fever, chills, fatigue and unexpected weight change.  Respiratory: Negative for shortness of breath.   Cardiovascular: Negative for chest pain, palpitations and leg swelling.  Gastrointestinal: Negative for abdominal pain.  Genitourinary: Negative for difficulty urinating.  Neurological: Negative for dizziness, weakness and numbness.  Hematological: Negative for adenopathy. Does not bruise/bleed easily.       Objective:   Physical Exam  Constitutional: He appears well-developed and well-nourished.  HENT:  Mouth/Throat: Oropharynx is clear and moist. No oropharyngeal exudate.  Neck: Neck supple. No thyromegaly present.  Cardiovascular: Normal rate, regular rhythm and normal heart sounds.   Pulmonary/Chest: Effort normal and breath sounds normal. No respiratory distress. He has no wheezes. He has no rales.  Lymphadenopathy:    He has no cervical adenopathy.  Neurological:       No weakness involving lower extremities. Deep tendon reflexes 2+ knee and ankle bilaterally. Normal sensory function to touch. Distal foot pulses are normal  Skin:       Patient has erythema right upper anterior thigh which is about 4 x 5 cm. He has indurated central  area which is nonfluctuant slightly tender to palpation. No purulent drainage          Assessment & Plan:   #1 cellulitis and possible early abscess right anterior thigh. Discussed pros and cons of I&D versus continued warm compresses antibiotic. Symptomatically states is improving some. Observe for now. If any fluctuance or progressive symptoms we'll need to consider I&D #2 intermittent paresthesias and dysesthesias left anterior thigh. Nonfocal exam. Observe for now.

## 2010-10-04 ENCOUNTER — Encounter: Payer: Self-pay | Admitting: Internal Medicine

## 2010-10-04 ENCOUNTER — Ambulatory Visit (INDEPENDENT_AMBULATORY_CARE_PROVIDER_SITE_OTHER): Payer: BC Managed Care – PPO | Admitting: Internal Medicine

## 2010-10-04 VITALS — BP 112/74 | Temp 98.2°F | Wt 290.0 lb

## 2010-10-04 DIAGNOSIS — L0390 Cellulitis, unspecified: Secondary | ICD-10-CM

## 2010-10-04 DIAGNOSIS — L0291 Cutaneous abscess, unspecified: Secondary | ICD-10-CM

## 2010-10-04 MED ORDER — OXYCODONE-ACETAMINOPHEN 5-500 MG PO CAPS: 1.0000 | ORAL_CAPSULE | ORAL | Status: AC | PRN

## 2010-10-04 MED ORDER — AMOXICILLIN-POT CLAVULANATE 875-125 MG PO TABS: 1.0000 | ORAL_TABLET | Freq: Two times a day (BID) | ORAL | Status: AC

## 2010-10-04 NOTE — Patient Instructions (Signed)
Keep area clean and dry  Take your antibiotic as prescribed until ALL of it is gone, but stop if you develop a rash, swelling, or any side effects of the medication.  Contact our office as soon as possible if  there are side effects of the medication.  Call or return to clinic prn if these symptoms worsen or fail to improve as anticipated.

## 2010-10-04 NOTE — Progress Notes (Signed)
  Subjective:    Patient ID: Antonio Watts, male    DOB: March 02, 1961, 50 y.o.   MRN: [id number]  HPI  50 year old patient who presents for followup of a painful soft tissue infection in the right groin area. Presently he is completing antibiotic therapy with Bactrim. He has been on hydrocodone analgesics with the very little benefit. He has tolerated his antibiotics well. Denies any fever or systemic complaints.    Review of Systems  Skin: Positive for rash and wound.       Objective:   Physical Exam  Constitutional: He appears well-developed and well-nourished.       Uncomfortable but in no acute distress. Temperature 98.2  Skin:       The patient had a 5 cm area of induration and erythema in the right groin area just distal to the inguinal crease. There is a small pustule in the center of the lesion. The area was tender erythematous warm to touch and quite indurated but not particularly fluctuant;  After prep and local anesthesia with 1% Xylocaine and I&D was performed. Very  little exudate was drained. Blunt  dissection with forceps also yielded no significant exudate.  Antibiotic ointment applied and the wound dressed          Assessment & Plan:   Soft tissue infection right groin. Status post I&D. We'll add Augmentin 875 twice a day. He will continue warm compresses 3-4 times daily. Additional analgesics prescribed. If unimproved has been asked to contact the office;  may require surgical referral

## 2010-10-05 ENCOUNTER — Telehealth: Payer: Self-pay | Admitting: *Deleted

## 2010-10-05 NOTE — Telephone Encounter (Signed)
Spoke with wife - inquiring about cleaning and dressing wound. Ok to wash with antibacterial 2-3 times a daily , dry, cover loosely , let be open to air when ever possible. States redness gone , feeling much better. KIK

## 2010-10-05 NOTE — Telephone Encounter (Signed)
Pt's wife would like to speak to [facility] about the dressing on pt's wound.

## 2010-10-10 ENCOUNTER — Telehealth: Payer: Self-pay

## 2010-10-10 NOTE — Telephone Encounter (Signed)
Pt's wife would like pt to see Dr. Kirtland Bouchard again about the open wound on his leg. She states that there is a hard mass forming around the inside of it and the "white infection" is visible. She said that Dr. Kirtland Bouchard suggested possible general surgeon so she would like to bring him back for further evaluation. Will call office for a same day on tomorrow

## 2010-10-12 ENCOUNTER — Encounter: Payer: Self-pay | Admitting: Internal Medicine

## 2010-10-12 ENCOUNTER — Ambulatory Visit (INDEPENDENT_AMBULATORY_CARE_PROVIDER_SITE_OTHER): Payer: BC Managed Care – PPO | Admitting: Internal Medicine

## 2010-10-12 DIAGNOSIS — L0291 Cutaneous abscess, unspecified: Secondary | ICD-10-CM

## 2010-10-12 DIAGNOSIS — L0390 Cellulitis, unspecified: Secondary | ICD-10-CM

## 2010-10-12 NOTE — Patient Instructions (Signed)
Complete antibiotic therapy  Call or return to clinic prn if these symptoms worsen or fail to improve as anticipated.  

## 2010-10-12 NOTE — Progress Notes (Signed)
  Subjective:    Patient ID: Antonio Watts, male    DOB: 06-10-1960, 50 y.o.   MRN: [id number]  HPI  50 year old patient who was seen last week for infected sebaceous cyst in the proximal right inner thigh region. This area was I&D and he is completing antibiotic therapy. He is tolerating the  antibiotics well and has approximately 2 additional days left of therapy. The area of infection is now pain free;  he was concerned about a persistent nodule in this area   Review of Systems  Skin: Positive for wound.       Objective:   Physical Exam  Skin:       The area of the right proximal thigh examined. The area has responded well to I&D and antibiotic therapy;  there is only very minimal redness and induration          Assessment & Plan:   Status post I&D of a infected right sebaceous cyst. Will complete antibiotic therapy

## 2010-11-20 ENCOUNTER — Telehealth: Payer: Self-pay | Admitting: Family Medicine

## 2010-11-20 NOTE — Telephone Encounter (Signed)
Please advise 

## 2010-11-20 NOTE — Telephone Encounter (Signed)
Please notify patient that I am not taking any new patients at this time

## 2010-11-20 NOTE — Telephone Encounter (Signed)
Pt is req to change pcps from Dr Caryl Never to Dr Amador Cunas. Pls advise if ok?

## 2010-11-21 NOTE — Telephone Encounter (Signed)
Pt has been notified via vm as noted.

## 2010-11-21 NOTE — Telephone Encounter (Signed)
Antonio Watts, please notify patient of Dr. Charm Rings response.  Thanks

## 2011-01-08 ENCOUNTER — Encounter: Payer: Self-pay | Admitting: Family Medicine

## 2011-01-08 ENCOUNTER — Ambulatory Visit (INDEPENDENT_AMBULATORY_CARE_PROVIDER_SITE_OTHER): Payer: BC Managed Care – PPO | Admitting: Family Medicine

## 2011-01-08 VITALS — BP 140/88 | Temp 98.7°F | Wt 300.0 lb

## 2011-01-08 DIAGNOSIS — N508 Other specified disorders of male genital organs: Secondary | ICD-10-CM

## 2011-01-08 DIAGNOSIS — N5089 Other specified disorders of the male genital organs: Secondary | ICD-10-CM

## 2011-01-08 DIAGNOSIS — M546 Pain in thoracic spine: Secondary | ICD-10-CM

## 2011-01-08 LAB — CBC
HCT: 39.2
Hemoglobin: 12.8 — ABNORMAL LOW
MCHC: 32.7
MCV: 74.7 — ABNORMAL LOW
Platelets: 258
RBC: 5.24
RDW: 15.4 — ABNORMAL HIGH
WBC: 7.8

## 2011-01-08 LAB — DIFFERENTIAL
Basophils Absolute: 0.1
Basophils Relative: 1
Eosinophils Absolute: 0.5
Eosinophils Relative: 6 — ABNORMAL HIGH
Lymphocytes Relative: 28
Lymphs Abs: 2.2
Monocytes Absolute: 0.8 — ABNORMAL HIGH
Monocytes Relative: 11
Neutro Abs: 4.3
Neutrophils Relative %: 54

## 2011-01-08 LAB — I-STAT 8, (EC8 V) (CONVERTED LAB)
Acid-Base Excess: 1
BUN: 11
Bicarbonate: 26 — ABNORMAL HIGH
Chloride: 104
Glucose, Bld: 124 — ABNORMAL HIGH
HCT: 44
Hemoglobin: 15
Operator id: 146091
Potassium: 3.8
Sodium: 138
TCO2: 27
pCO2, Ven: 41.7 — ABNORMAL LOW
pH, Ven: 7.403 — ABNORMAL HIGH

## 2011-01-08 LAB — POCT CARDIAC MARKERS
CKMB, poc: 1.6
CKMB, poc: 2.1
Myoglobin, poc: 127
Myoglobin, poc: 133
Operator id: 146091
Operator id: 146091
Troponin i, poc: <0.05
Troponin i, poc: <0.05

## 2011-01-08 LAB — POCT I-STAT CREATININE
Creatinine, Ser: 1
Operator id: 146091

## 2011-01-08 LAB — D-DIMER, QUANTITATIVE: D-Dimer, Quant: 0.25

## 2011-01-08 MED ORDER — NAPROXEN 500 MG PO TABS: 500.0000 mg | ORAL_TABLET | Freq: Two times a day (BID) | ORAL | Status: DC

## 2011-01-08 MED ORDER — CYCLOBENZAPRINE HCL 10 MG PO TABS: 10.0000 mg | ORAL_TABLET | Freq: Three times a day (TID) | ORAL | Status: DC | PRN

## 2011-01-08 NOTE — Progress Notes (Signed)
  Subjective:    Patient ID: Antonio Watts, male    DOB: 19-Feb-1961, 50 y.o.   MRN: [id number]  HPI Patient seen for the following issues  Left thoracic back pain. Onset this morning while reaching in the shower. Advil helps. Worse with movement. Pain much improved following anti-inflammatory. Generalized soreness. No radiation. No dyspnea.  Symptoms mild at this time.  Left testicle lump noted recently. Nonpainful. Similar right testicle mass 2 years ago. Ultrasound revealed no concerning masses. Patient denies hernia. No dysuria. No appetite or weight changes.  Past Medical History  Diagnosis Date  . Kidney stones   . OSA (obstructive sleep apnea)    No past surgical history on file.  reports that he has been smoking.  He has never used smokeless tobacco. He reports that he does not drink alcohol. His drug history not on file. family history includes Cancer in an unspecified family member and Heart disease in an unspecified family member. Allergies  Allergen Reactions  . Doxycycline Hyclate     REACTION: rash, swelling      Review of Systems  Constitutional: Negative for appetite change and unexpected weight change.  Respiratory: Negative for cough and shortness of breath.   Cardiovascular: Negative for chest pain, palpitations and leg swelling.  Gastrointestinal: Negative for abdominal pain.  Genitourinary: Positive for frequency. Negative for dysuria, hematuria, scrotal swelling and testicular pain.  Skin: Negative for rash.  Hematological: Negative for adenopathy.       Objective:   Physical Exam  Constitutional: He appears well-developed and well-nourished.  HENT:  Mouth/Throat: Oropharynx is clear and moist.  Neck: Neck supple. No thyromegaly present.  Cardiovascular: Normal rate and regular rhythm.   Pulmonary/Chest: Effort normal and breath sounds normal. No respiratory distress. He has no wheezes. He has no rales.  Genitourinary:       Patient has a couple  small cystic mobile type lesions which are nontender and not fixed his testicle right and left side. No hernia. No evidence for hydrocele or varicocele  Musculoskeletal:       Minimally tender left thoracic musculature. No spinal tenderness          Assessment & Plan:  #1 left thoracic soft tissue muscle strain. Naproxen 500 mg twice a day with food and cyclobenzaprine 10 mg each bedtime as needed #2 right scrotal mass. Suspect spermatocele. The mass in question is not fixed to the testicle. Very rounded and compatible with likely spermatocele. Observe closely. If any growth in size or changes ultrasound repeat

## 2011-01-08 NOTE — Patient Instructions (Signed)
Testicular Masses, Growth in a Testicle        Most testicular masses, such as a growth or a swelling, are benign. This means they are not cancerous. Common swellings in the testicle are:    Hydrocele is the most common benign testicular mass in an adult. Hydroceles are generally soft, painless scrotal swellings that are collections of fluid in the scrotal sac. These can rapidly change size as the fluid enters or leaves.    Spermatoceles are generally soft, painless, benign swellings that are cyst-like masses in the scrotum containing fluid. They can rapidly change size as the fluid enters or leaves. They are more prominent while standing or exercising. Sometimes, spermatoceles may cause a sensation of heaviness or a dull ache.    Varicocele is an enlargement of the veins that drain the testicles. This condition can increase the risk of infertility. They are more prominent while standing or exercising. Sometimes, varicoceles may cause a sensation of heaviness or a dull ache.    Inguinal hernia is a bulge caused by a portion of intestine protruding into the scrotum through a weak area in the abdominal muscles. Hernias may or may not be painful. They are soft and usually enlarge with coughing or straining.    Torsion of the testis can cause a testicular mass that develops quickly and is associated with tenderness and/or fever. This is caused by a twisting of the testicle within the sac. It also reduces the blood supply and can destroy the testis if not treated quickly with surgery.    Epididymitis is inflammation of the epididymis (a structure attached to the testicle), usually caused by a sexually transmitted infection or a urinary tract infection. This generally shows up as testicular discomfort and swelling, and may include pain during urination.    Testicular appendages are remnants of tissue on the testis present since birth. A testicular appendage can twist on its blood supply and cause pain. In most  cases, this is seen as a blue dot on the scrotum.   A cancerous growth in the scrotum may first appear as a swelling. There may or may not be pain. The growth usually feels firm and shows up as a growth on the testicle. Any solid, firm growth in a testicle is considered cancer until proven otherwise.   Cancer of the testicle most commonly affects men 20 to 51 years old. Risk factors include prior testicular tumor and cryptorchidism (undescended testis). Occasionally, testicular cancer may appear with symptoms (problems) of metastasis. This means the tumor (abnormal growth) has spread and is causing other problems that may include cough, shortness of breath or weight loss. Monthly testicular self-exams are recommended for all men. Get in the habit of examining your own testicles. A good time is while taking a shower. Get to know what your testicles feel like so you will know if there is a new growth or change in them.   DIAGNOSIS   See your caregiver if you feel a growth in your testicle. Sometimes, all that is needed to make the diagnosis (determine what is wrong) is a physical exam. Your caregiver may shine a bright light through the scrotum to help make the diagnosis. This is called transillumination. The light will shine easily through a collection of fluid but will usually not shine through a tumor. Other testing, including blood tests and an ultrasound exam, may be done. An ultrasound exam bounces harmless sound waves off the testicles and produces a black and white picture almost   like that produced by a camera. Diagnosis of testicular cancer can be made by measuring several substances in the blood, called markers), that may indicate the presence of certain cancers.   TREATMENT   What is wrong determines how it is treated. Small hydroceles and spermatoceles often require no treatment. In some cases, however, they may be treated surgically. Hernias are repaired with surgery. Because epididymitis is usually  caused by an infection, it is usually treated with antibiotics. Varicoceles may be treated by surgery to tie off the affected veins. Testicular cancer treatment depends upon the type of cancer. Sometimes, some tissue is removed surgically as a way of trying to preserve the testicle but if a tumor is suspected, the preferred treatment is removal of the entire testicle. Further treatment may include watching the growth with strict follow-up, chemotherapy or radiation.   If a growth has been found in a testicle, your caregiver will help you determine the best treatment.   Document Released: 09/15/2002 Document Re-Released: 06/05/2009   [employer] Patient Information 2011 [employer].

## 2011-01-18 ENCOUNTER — Encounter: Payer: Self-pay | Admitting: Family Medicine

## 2011-01-18 ENCOUNTER — Ambulatory Visit (INDEPENDENT_AMBULATORY_CARE_PROVIDER_SITE_OTHER): Payer: BC Managed Care – PPO | Admitting: Family Medicine

## 2011-01-18 VITALS — BP 140/84 | Temp 98.4°F | Wt 299.0 lb

## 2011-01-18 DIAGNOSIS — J3489 Other specified disorders of nose and nasal sinuses: Secondary | ICD-10-CM

## 2011-01-18 DIAGNOSIS — J069 Acute upper respiratory infection, unspecified: Secondary | ICD-10-CM

## 2011-01-18 DIAGNOSIS — R059 Cough, unspecified: Secondary | ICD-10-CM

## 2011-01-18 DIAGNOSIS — R05 Cough: Secondary | ICD-10-CM

## 2011-01-18 NOTE — Patient Instructions (Signed)
Upper Respiratory Infection, Adult An upper respiratory infection (URI) is also known as the common cold. It is often caused by a type of germ (virus). Colds are easily spread (contagious). You can pass it to others by kissing, coughing, sneezing, or drinking out of the same glass. Usually, you get better in 1 or 2 weeks.  HOME CARE   Only take medicine as told by your doctor.   Use a warm mist humidifier or breathe in steam from a hot shower.   Drink enough water and fluids to keep your pee (urine) clear or pale yellow.   Get plenty of rest.   Return to work when your temperature is back to normal or as told by your doctor. You may use a face mask and wash your hands to stop your cold from spreading.  GET HELP RIGHT AWAY IF:   After the first few days, you feel you are getting worse.   You have questions about your medicine.   You have chills, shortness of breath, or brown or red spit (mucus).   You have yellow or brown snot (nasal discharge) or pain in the face, especially when you bend forward.   You have a fever, puffy (swollen) neck, pain when you swallow, or white spots in the back of your throat.   You have a bad headache, ear pain, sinus pain, or chest pain.   You have a high-pitched whistling sound when you breathe in and out (wheezing).   You have a lasting cough or cough up blood.   You have sore muscles or a stiff neck.  MAKE SURE YOU:   Understand these instructions.   Will watch your condition.   Will get help right away if you are not doing well or get worse.  Document Released: 08/28/2007 Document Revised: 11/21/2010 Document Reviewed: 07/16/2010 [facility] Patient Information 2012 [place], [place].  Consider mucinex D for congestive symptoms.

## 2011-01-18 NOTE — Progress Notes (Signed)
  Subjective:    Patient ID: Antonio Watts, male    DOB: 10-09-1960, 50 y.o.   MRN: [id number]  HPI  Acute visit. Two day history of nasal congestion. Intermittent headaches. Dry cough. Increase malaise. Denies any fever. He's tried some plain Mucinex and Robitussin. Denies any nausea, vomiting, or diarrhea.   Review of Systems  Constitutional: Positive for fatigue. Negative for fever and chills.  HENT: Positive for congestion, postnasal drip and sinus pressure. Negative for ear pain, voice change and ear discharge.   Respiratory: Positive for cough. Negative for shortness of breath and wheezing.   Cardiovascular: Negative for chest pain.  Gastrointestinal: Negative for nausea, vomiting and diarrhea.  Neurological: Positive for headaches.       Objective:   Physical Exam  Constitutional: He appears well-developed and well-nourished.  HENT:  Right Ear: External ear normal.  Left Ear: External ear normal.  Mouth/Throat: Oropharynx is clear and moist.  Neck: Neck supple.  Cardiovascular: Normal rate and regular rhythm.   Pulmonary/Chest: Effort normal and breath sounds normal. No respiratory distress. He has no wheezes. He has no rales.  Musculoskeletal: He exhibits no edema.  Lymphadenopathy:    He has no cervical adenopathy.          Assessment & Plan:  Probable viral URI. Try Mucinex D and continued anti-inflammatories as needed. Followup as needed

## 2011-04-01 ENCOUNTER — Ambulatory Visit: Payer: BC Managed Care – PPO | Admitting: Family Medicine

## 2011-04-02 ENCOUNTER — Ambulatory Visit (INDEPENDENT_AMBULATORY_CARE_PROVIDER_SITE_OTHER): Payer: BC Managed Care – PPO | Admitting: Family

## 2011-04-02 ENCOUNTER — Encounter: Payer: Self-pay | Admitting: Family

## 2011-04-02 VITALS — BP 136/92 | Temp 98.4°F | Wt 298.0 lb

## 2011-04-02 DIAGNOSIS — R220 Localized swelling, mass and lump, head: Secondary | ICD-10-CM

## 2011-04-02 DIAGNOSIS — R221 Localized swelling, mass and lump, neck: Secondary | ICD-10-CM

## 2011-04-02 NOTE — Progress Notes (Signed)
Subjective:    Patient ID: Antonio Watts, male    DOB: [birth year], 51 y.o.   MRN: [id number]  HPI 51 year old white male , pack per day smoker x30 years his in today with complaint of a mass the left side of his neck that he noticed one week ago. The mass is occasionally tender, and firm to touch. He denies any sneezing cough and, congestion, ear pain, or dental pain. Denies any history of malignancy.   Review of Systems  Constitutional: Negative.   HENT: Positive for neck pain.        Minimal left neck pain , mass noted  Respiratory: Negative.   Cardiovascular: Negative.   Gastrointestinal: Negative.   Genitourinary: Negative.   Neurological: Negative.   Hematological: Negative.   Psychiatric/Behavioral: Negative.    Past Medical History  Diagnosis Date  . Kidney stones   . OSA (obstructive sleep apnea)     History   Social History  . Marital Status: Married    Spouse Name: N/A    Number of Children: N/A  . Years of Education: N/A   Occupational History  . Not on file.   Social History Main Topics  . Smoking status: Current Everyday Smoker  . Smokeless tobacco: Never Used  . Alcohol Use: No  . Drug Use: Not on file  . Sexually Active: Not on file   Other Topics Concern  . Not on file   Social History Narrative  . No narrative on file    No past surgical history on file.  Family History  Problem Relation Age of Onset  . Cancer      fhx  . Heart disease      fhx    Allergies  Allergen Reactions  . Doxycycline Hyclate     REACTION: rash, swelling    Current Outpatient Prescriptions on File Prior to Visit  Medication Sig Dispense Refill  . naproxen (NAPROSYN) 500 MG tablet Take 1 tablet (500 mg total) by mouth 2 (two) times daily with a meal.  30 tablet  0  . NON FORMULARY CPAP at bedtime       . albuterol (VENTOLIN HFA) 108 (90 BASE) MCG/ACT inhaler Inhale 2 puffs into the lungs every 6 (six) hours as needed.          BP 136/92  Temp(Src)  98.4 F (36.9 C) (Oral)  Wt 298 lb (135.172 kg)chart   Objective:   Physical Exam  Constitutional: He is oriented to person, place, and time. He appears well-nourished.  HENT:  Right Ear: External ear normal.  Left Ear: External ear normal.  Mouth/Throat: Oropharynx is clear and moist.  Neck: Normal range of motion. No tracheal deviation present. No thyromegaly present.       Palpable mass noted to the left neck right under the jaw line. The area is firm, nonmobile. About the size of a quarter.  Cardiovascular: Normal rate, regular rhythm and normal heart sounds.   Pulmonary/Chest: Effort normal and breath sounds normal.  Musculoskeletal: Normal range of motion.  Lymphadenopathy:    He has no cervical adenopathy.  Neurological: He is alert and oriented to person, place, and time.  Skin: Skin is warm and dry.  Psychiatric: He has a normal mood and affect.          Assessment & Plan:  Assessment: Neck mass  Plan: Ultrasound of the left neck to evaluate left neck mass. We'll follow the patient in the results, and discuss further treatment plan if  necessary.

## 2011-04-08 ENCOUNTER — Ambulatory Visit
Admission: RE | Admit: 2011-04-08 | Discharge: 2011-04-08 | Disposition: A | Discharge status: Home / Self Care | Payer: BC Managed Care – PPO | Source: Ambulatory Visit | Attending: Family | Admitting: Family

## 2011-04-08 DIAGNOSIS — R221 Localized swelling, mass and lump, neck: Secondary | ICD-10-CM

## 2011-04-08 NOTE — Progress Notes (Signed)
Quick Note:  Pt aware and would like referral to ENT. ______

## 2011-04-09 ENCOUNTER — Other Ambulatory Visit: Payer: Self-pay | Admitting: Family

## 2011-04-09 DIAGNOSIS — R599 Enlarged lymph nodes, unspecified: Secondary | ICD-10-CM

## 2011-04-15 ENCOUNTER — Encounter: Payer: Self-pay | Admitting: Family Medicine

## 2011-04-15 ENCOUNTER — Encounter (HOSPITAL_COMMUNITY): Payer: Self-pay | Admitting: *Deleted

## 2011-04-15 ENCOUNTER — Emergency Department (HOSPITAL_COMMUNITY)
Admission: EM | Admit: 2011-04-15 | Discharge: 2011-04-15 | Disposition: A | Discharge status: Home / Self Care | Payer: BC Managed Care – PPO | Attending: Emergency Medicine | Admitting: Emergency Medicine

## 2011-04-15 ENCOUNTER — Ambulatory Visit (INDEPENDENT_AMBULATORY_CARE_PROVIDER_SITE_OTHER): Payer: BC Managed Care – PPO | Admitting: Family Medicine

## 2011-04-15 ENCOUNTER — Ambulatory Visit: Payer: BC Managed Care – PPO | Admitting: Family Medicine

## 2011-04-15 VITALS — BP 142/90 | Temp 98.8°F | Wt 304.0 lb

## 2011-04-15 DIAGNOSIS — G4733 Obstructive sleep apnea (adult) (pediatric): Secondary | ICD-10-CM | POA: Insufficient documentation

## 2011-04-15 DIAGNOSIS — Z23 Encounter for immunization: Secondary | ICD-10-CM | POA: Insufficient documentation

## 2011-04-15 DIAGNOSIS — Z203 Contact with and (suspected) exposure to rabies: Secondary | ICD-10-CM | POA: Insufficient documentation

## 2011-04-15 MED ORDER — RABIES VACCINE, PCEC IM SUSR
1.0000 mL | Freq: Once | INTRAMUSCULAR | Status: AC
  Administered 2011-04-15: 1 mL via INTRAMUSCULAR
  Filled 2011-04-15: qty 1

## 2011-04-15 NOTE — Progress Notes (Signed)
  Subjective:    Patient ID: Antonio Watts, male    DOB: 12-14-1960, 51 y.o.   MRN: [id number]  HPI  Acute visit. Patient discovered a bat in his bedroom Sunday morning around 3:30 AM. His dog was agitated and that is when he woke up and discovered a bat. There was no obvious bite mark or any bleeding.  Unfortunately, the sheriff officer who recovered the bat threw bat away after killing the bat. The bat was unavailable to animal control for testing.  Patient has not had any prior rabies vaccination.   Review of Systems  Constitutional: Negative for fever and chills.  Respiratory: Negative for cough.   Hematological: Negative for adenopathy.       Objective:   Physical Exam  Constitutional: He appears well-developed and well-nourished.  Cardiovascular: Normal rate and regular rhythm.   Pulmonary/Chest: Effort normal and breath sounds normal. No respiratory distress. He has no wheezes. He has no rales.  Skin:       No obvious wounds or puncture marks          Assessment & Plan:  Recent bat exposure. Even though no obvious bite marks we cannot exclude rabies risk. We have advised consideration for rabies immunoglobulin and 5 shot vaccine series. Patient is aware that this is only available through the local emergency department and we contacted [facility] and he will present there for further treatment.

## 2011-04-15 NOTE — ED Notes (Signed)
A bat was found flying around in his bedroom on Sunday.  ?? rabies

## 2011-04-15 NOTE — ED Notes (Signed)
Discharge instructions reviewed; verbalizes understanding.  No further c/o's voiced.  Pt ambulatory to lobby.

## 2011-04-15 NOTE — Patient Instructions (Signed)
You will need to get rabies immunoglobulin and rabies vaccine and these will need to be administered through the health department.

## 2011-04-15 NOTE — ED Provider Notes (Signed)
History     CSN: [id number]  Arrival date & time 04/15/11  1714   First MD Initiated Contact with Patient 04/15/11 2013      Chief Complaint  Patient presents with  . Rabies Injection     HPI  History provided by the patient and spouse. Patient is a 51 year old male who presents with concerns for possible rabies exposure. Patient and spouse both report having a bat trapped inside their home on Sunday. Patient and spouse were asleep at bedtime he did not report having any known bites or contact with the bat. They called the local sheriff's department who removed the bat and released it. Animal control also came to their home and was disappointed that was released. They recommended that the patient and spouse contacted physicians to consult for possible rabies exposure due to the fact they were unable to trap and test the bat. Patient did call PCP and was strongly encouraged to have rabies vaccinations. Patient was unable to get rabies vaccinations through PCP and was instructed to come to the emergency room. Patient denies having any symptoms. No fever, chills, sweats. Patient has no other complaints or concerns.    Past Medical History  Diagnosis Date  . Kidney stones   . OSA (obstructive sleep apnea)     History reviewed. No pertinent past surgical history.  Family History  Problem Relation Age of Onset  . Cancer      fhx  . Heart disease      fhx    History  Substance Use Topics  . Smoking status: Current Everyday Smoker  . Smokeless tobacco: Never Used  . Alcohol Use: No      Review of Systems  All other systems reviewed and are negative.    Allergies  Doxycycline hyclate  Home Medications   Current Outpatient Rx  Name Route Sig Dispense Refill  . CYCLOBENZAPRINE HCL 10 MG PO TABS Oral Take 10 mg by mouth 2 (two) times daily as needed. For muscle spasms.    Marland Kitchen NAPROXEN 500 MG PO TABS Oral Take 500 mg by mouth 2 (two) times daily with a meal.    . NON  FORMULARY  CPAP at bedtime       BP 168/97  Pulse 63  Temp(Src) 97.8 F (36.6 C) (Oral)  Resp 16  SpO2 98%  Physical Exam  Nursing note and vitals reviewed. Constitutional: He is oriented to person, place, and time. He appears well-developed and well-nourished. No distress.  HENT:  Head: Normocephalic and atraumatic.  Cardiovascular: Normal rate and regular rhythm.   Pulmonary/Chest: Effort normal and breath sounds normal. No respiratory distress.  Abdominal: Soft.  Neurological: He is alert and oriented to person, place, and time.  Skin: Skin is warm. No rash noted. No erythema.       No concerning marks or lesions on skin    ED Course  Procedures      1. Exposure to rabies       MDM  8:10 PM patient seen and evaluated. Patient in no acute distress. Patient with no current complaints or symptoms.        Angus Seller, [place] 04/15/11 2059

## 2011-04-16 ENCOUNTER — Telehealth (HOSPITAL_COMMUNITY): Payer: Self-pay | Admitting: *Deleted

## 2011-04-16 NOTE — ED Provider Notes (Signed)
Medical screening examination/treatment/procedure(s) were performed by non-physician practitioner and as supervising physician I was immediately available for consultation/collaboration.  Rabies Vaccine was stated  Flint Melter, MD 04/16/11 1235

## 2011-04-18 ENCOUNTER — Emergency Department (HOSPITAL_COMMUNITY)
Admission: EM | Admit: 2011-04-18 | Discharge: 2011-04-18 | Disposition: A | Discharge status: Still In Hospital | Payer: BC Managed Care – PPO | Attending: Emergency Medicine | Admitting: Emergency Medicine

## 2011-04-18 ENCOUNTER — Encounter (HOSPITAL_COMMUNITY): Payer: Self-pay | Admitting: *Deleted

## 2011-04-18 DIAGNOSIS — Z203 Contact with and (suspected) exposure to rabies: Secondary | ICD-10-CM | POA: Insufficient documentation

## 2011-04-18 DIAGNOSIS — Z23 Encounter for immunization: Secondary | ICD-10-CM

## 2011-04-18 MED ORDER — RABIES VACCINE, PCEC IM SUSR
1.0000 mL | Freq: Once | INTRAMUSCULAR | Status: AC
  Administered 2011-04-18: 1 mL via INTRAMUSCULAR
  Filled 2011-04-18: qty 1

## 2011-04-18 MED ORDER — RABIES IMMUNE GLOBULIN 150 UNIT/ML IM INJ
20.0000 [IU]/kg | INJECTION | Freq: Once | INTRAMUSCULAR | Status: AC
  Administered 2011-04-18: 2775 [IU] via INTRAMUSCULAR
  Filled 2011-04-18: qty 18.5

## 2011-04-18 NOTE — ED Notes (Signed)
pts exposed to bats, here for second round of shots.  No distress.

## 2011-04-18 NOTE — ED Notes (Signed)
Patient here for 2nd rabies vaccine and immune globulin

## 2011-04-18 NOTE — ED Provider Notes (Signed)
History     CSN: [id number]  Arrival date & time 04/18/11  0865   First MD Initiated Contact with Patient 04/18/11 0530      Chief Complaint  Patient presents with  . Rabies Injection    (Consider location/radiation/quality/duration/timing/severity/associated sxs/prior treatment) The history is provided by the patient.   here for rabies vaccination series #3 and immunoglobulin which she never received. In the same room with the bat that was captured and released prior to animal control being involved. Postpartum and is recommending rabies series and immunoglobulin. No recent rash, fever, weakness or any symptoms. Patient has no complaints.  Past Medical History  Diagnosis Date  . Kidney stones   . OSA (obstructive sleep apnea)     History reviewed. No pertinent past surgical history.  Family History  Problem Relation Age of Onset  . Cancer      fhx  . Heart disease      fhx    History  Substance Use Topics  . Smoking status: Current Everyday Smoker  . Smokeless tobacco: Never Used  . Alcohol Use: No      Review of Systems  Constitutional: Negative for fever and chills.  HENT: Negative for neck pain and neck stiffness.   Eyes: Negative for pain.  Respiratory: Negative for shortness of breath.   Cardiovascular: Negative for chest pain.  Gastrointestinal: Negative for abdominal pain.  Genitourinary: Negative for dysuria.  Musculoskeletal: Negative for back pain.  Skin: Negative for rash.  Neurological: Negative for headaches.  All other systems reviewed and are negative.    Allergies  Doxycycline hyclate  Home Medications   Current Outpatient Rx  Name Route Sig Dispense Refill  . CYCLOBENZAPRINE HCL 10 MG PO TABS Oral Take 10 mg by mouth 2 (two) times daily as needed. For muscle spasms.    Marland Kitchen NAPROXEN 500 MG PO TABS Oral Take 500 mg by mouth 2 (two) times daily with a meal.    . NON FORMULARY  CPAP at bedtime       BP 148/97  Pulse 62  Temp(Src)  97.1 F (36.2 C) (Oral)  Resp 18  Ht 5\' 9"  (1.753 m)  Wt 304 lb (137.893 kg)  BMI 44.89 kg/m2  SpO2 98%  Physical Exam  Constitutional: He is oriented to person, place, and time. He appears well-developed and well-nourished.  HENT:  Head: Normocephalic and atraumatic.  Eyes: Conjunctivae are normal. Pupils are equal, round, and reactive to light.  Neck: Trachea normal. Neck supple.  Cardiovascular: S1 normal, S2 normal and normal pulses.     No systolic murmur is present   No diastolic murmur is present  Pulses:      Radial pulses are 2+ on the right side, and 2+ on the left side.       No clubbing cyanosis or edema  Pulmonary/Chest: Effort normal. No respiratory distress. He has no rhonchi.  Abdominal: Soft. Normal appearance and bowel sounds are normal. There is no tenderness. There is no CVA tenderness and negative Murphy's sign.  Musculoskeletal:       Moves all extremities x4  Neurological: He is alert and oriented to person, place, and time. He has normal strength. No sensory deficit. GCS eye subscore is 4. GCS verbal subscore is 5. GCS motor subscore is 6.       Gait normal and no deficits  Skin: Skin is warm and dry. No rash noted. He is not diaphoretic.  Psychiatric: His speech is normal.  Cooperative and appropriate    ED Course  Procedures (including critical care time)  Rabies series #3 and immunoglobulin provided   MDM   Rabies injections as above. Stable for discharge home.        Sunnie Nielsen, MD 04/18/11 [phone]

## 2011-04-22 ENCOUNTER — Emergency Department (INDEPENDENT_AMBULATORY_CARE_PROVIDER_SITE_OTHER)
Admission: EM | Admit: 2011-04-22 | Discharge: 2011-04-22 | Disposition: A | Discharge status: Home / Self Care | Payer: BC Managed Care – PPO | Source: Home / Self Care

## 2011-04-22 ENCOUNTER — Encounter (HOSPITAL_COMMUNITY): Payer: Self-pay | Admitting: *Deleted

## 2011-04-22 DIAGNOSIS — Z23 Encounter for immunization: Secondary | ICD-10-CM

## 2011-04-22 HISTORY — DX: Calculus of kidney: N20.0

## 2011-04-22 MED ORDER — RABIES VACCINE, PCEC IM SUSR
1.0000 mL | Freq: Once | INTRAMUSCULAR | Status: AC
  Administered 2011-04-22: 1 mL via INTRAMUSCULAR

## 2011-04-22 MED ORDER — RABIES VACCINE, PCEC IM SUSR
INTRAMUSCULAR | Status: AC
  Filled 2011-04-22: qty 1

## 2011-04-22 NOTE — ED Notes (Signed)
Here for 3rd rabies vaccine for bat exposure in the house.

## 2011-04-29 ENCOUNTER — Encounter (HOSPITAL_COMMUNITY): Payer: Self-pay | Admitting: *Deleted

## 2011-04-29 ENCOUNTER — Emergency Department (INDEPENDENT_AMBULATORY_CARE_PROVIDER_SITE_OTHER)
Admission: EM | Admit: 2011-04-29 | Discharge: 2011-04-29 | Disposition: A | Discharge status: Home / Self Care | Payer: BC Managed Care – PPO | Source: Home / Self Care

## 2011-04-29 DIAGNOSIS — Z23 Encounter for immunization: Secondary | ICD-10-CM

## 2011-04-29 MED ORDER — RABIES VACCINE, PCEC IM SUSR
1.0000 mL | Freq: Once | INTRAMUSCULAR | Status: AC
  Administered 2011-04-29: 1 mL via INTRAMUSCULAR

## 2011-04-29 MED ORDER — RABIES VACCINE, PCEC IM SUSR
INTRAMUSCULAR | Status: AC
  Filled 2011-04-29: qty 1

## 2011-04-29 NOTE — ED Notes (Signed)
Here for last rabies vaccine for bat exposure. No previous problems with injections.

## 2011-05-29 ENCOUNTER — Ambulatory Visit (INDEPENDENT_AMBULATORY_CARE_PROVIDER_SITE_OTHER)
Admission: RE | Admit: 2011-05-29 | Discharge: 2011-05-29 | Disposition: A | Discharge status: Home / Self Care | Payer: BC Managed Care – PPO | Source: Ambulatory Visit | Attending: Family Medicine | Admitting: Family Medicine

## 2011-05-29 ENCOUNTER — Ambulatory Visit (INDEPENDENT_AMBULATORY_CARE_PROVIDER_SITE_OTHER): Payer: BC Managed Care – PPO | Admitting: Family Medicine

## 2011-05-29 ENCOUNTER — Encounter: Payer: Self-pay | Admitting: Family Medicine

## 2011-05-29 VITALS — BP 160/88 | Temp 98.2°F | Wt 301.0 lb

## 2011-05-29 DIAGNOSIS — M25521 Pain in right elbow: Secondary | ICD-10-CM

## 2011-05-29 DIAGNOSIS — M25529 Pain in unspecified elbow: Secondary | ICD-10-CM

## 2011-05-29 DIAGNOSIS — G5600 Carpal tunnel syndrome, unspecified upper limb: Secondary | ICD-10-CM

## 2011-05-29 MED ORDER — MELOXICAM 15 MG PO TABS: 15.0000 mg | ORAL_TABLET | Freq: Every day | ORAL | Status: DC

## 2011-05-29 NOTE — Progress Notes (Signed)
  Subjective:    Patient ID: Antonio Watts, male    DOB: May 30, 1960, 51 y.o.   MRN: [id number]  HPI  Presents with 2 separate issues. First is left thumb going numb mostly during the night for several weeks. Occasional involvement of the left second digit as well. Minimal pain. Denies any wrist pain. No neck pain. No definite weakness. Symptoms are usually very transient. Sometimes relieved with change of position. No exacerbating features. No history of carpal tunnel. Right-hand-dominant. He does not take any medications.  Right elbow pain. Poorly localized. Pain seems to be anterior and medial. He describes a sharp stabbing type pain which seems to be deep in the elbow region. Occasionally worse with lifting. Sometimes at rest. Denies any numbness. He has pain sometimes poorly localized with lifting. No neck pain. Occasional distal biceps tendon pain no known injury. No swelling.  Past Medical History  Diagnosis Date  . OSA (obstructive sleep apnea)   . Kidney stones   . Kidney calculi    No past surgical history on file.  reports that he quit smoking 3 days ago. His smoking use included Cigarettes. He has a 35 pack-year smoking history. He has never used smokeless tobacco. He reports that he does not drink alcohol or use illicit drugs. family history includes Cancer in an unspecified family member; Heart disease in an unspecified family member; and Heart failure in an unspecified family member. Allergies  Allergen Reactions  . Doxycycline Hyclate     REACTION: rash, swelling      Review of Systems  Respiratory: Negative for shortness of breath.   Cardiovascular: Negative for chest pain.  Neurological: Positive for numbness. Negative for weakness.  Hematological: Negative for adenopathy.       Objective:   Physical Exam  Constitutional: He appears well-developed and well-nourished.  Cardiovascular: Normal rate and regular rhythm.   Pulmonary/Chest: Effort normal and breath  sounds normal. No respiratory distress. He has no wheezes. He has no rales.  Musculoskeletal:       Upper extremities examined. No visible swelling. No erythema. Full range of motion all joints. Left hand reveals no muscle atrophy. Good distal pulses.  Right elbow reveals full range of motion. Minimal discomfort with supination /pronation. No lateral or medial epicondylar tenderness. Minimal distal biceps tenderness.  Neurological:       Full-strength upper extremities. Symmetric reflexes. Normal sensory function.          Assessment & Plan:  #1 left thumb paresthesias. Suspect carpal tunnel syndrome. Left wrist splint and touch base 2-3 weeks if not improving.  meloxicam #2 right elbow pain uncertain etiology. Question ulnar nerve entrapment but has no associated numbness. Given nondescript nature start with x-rays. Consider orthopedic referral if persists after 2 weeks on meloxicam

## 2011-05-29 NOTE — Progress Notes (Signed)
Quick Note:  Pt informed ______ 

## 2011-05-29 NOTE — Patient Instructions (Signed)
Be in touch in 2-3 weeks if left and right upper extremity symptoms not resolving

## 2011-07-08 ENCOUNTER — Encounter: Payer: Self-pay | Admitting: Family Medicine

## 2011-07-08 ENCOUNTER — Ambulatory Visit (INDEPENDENT_AMBULATORY_CARE_PROVIDER_SITE_OTHER): Payer: BC Managed Care – PPO | Admitting: Family Medicine

## 2011-07-08 VITALS — BP 140/90 | Temp 98.2°F | Wt 300.0 lb

## 2011-07-08 DIAGNOSIS — L821 Other seborrheic keratosis: Secondary | ICD-10-CM

## 2011-07-08 DIAGNOSIS — G5600 Carpal tunnel syndrome, unspecified upper limb: Secondary | ICD-10-CM

## 2011-07-08 DIAGNOSIS — L988 Other specified disorders of the skin and subcutaneous tissue: Secondary | ICD-10-CM

## 2011-07-08 DIAGNOSIS — M25529 Pain in unspecified elbow: Secondary | ICD-10-CM

## 2011-07-08 DIAGNOSIS — M25521 Pain in right elbow: Secondary | ICD-10-CM

## 2011-07-08 MED ORDER — CYCLOBENZAPRINE HCL 10 MG PO TABS: 10.0000 mg | ORAL_TABLET | Freq: Two times a day (BID) | ORAL | Status: DC | PRN

## 2011-07-08 MED ORDER — MELOXICAM 15 MG PO TABS: 15.0000 mg | ORAL_TABLET | Freq: Every day | ORAL | Status: DC

## 2011-07-08 NOTE — Progress Notes (Signed)
  Subjective:    Patient ID: Antonio Watts, male    DOB: 05/11/1960, 51 y.o.   MRN: [id number]  HPI  Patient presents with irritated verrucous-type keratosis right scalp. Recently this was accidentally scraped with a haircut and bled slightly. He would like to have this treated. History of similar lesion left scalp which was treated with liquid nitrogen successfully over one year ago. Requesting the same. No personal or family history of skin cancer  Right elbow pain from last visit improved with meloxicam. After running out of medication had recurrent flare of elbow pain and requesting refill. Plain x-rays were unremarkable. No cervical pain or shoulder pain.  Pain is sharp and sometimes irritated with movement. No numbness or weakness.  Review of Systems  Constitutional: Negative for appetite change and unexpected weight change.  Neurological: Negative for weakness and numbness.       Objective:   Physical Exam  Constitutional: He appears well-developed and well-nourished.  Cardiovascular: Normal rate and regular rhythm.   Pulmonary/Chest: Effort normal and breath sounds normal. No respiratory distress. He has no wheezes. He has no rales.  Musculoskeletal: He exhibits no edema.       Range of motion right elbow. No evidence for inflammatory changes. No localized tenderness.  Skin:       verrucous type of lesion right temporal scalp. Approximately 1 cm diameter.          Assessment & Plan:  #1 ?verrucous keratosis right scalp. Discussed risk and benefits of treatment with liquid nitrogen and patient consented. This was treated without difficulty and patient tolerated well. #2 right elbow pain. Overall improved. Continue meloxicam as needed this was refilled.

## 2011-08-30 ENCOUNTER — Encounter: Payer: Self-pay | Admitting: Family Medicine

## 2011-08-30 ENCOUNTER — Telehealth: Payer: Self-pay | Admitting: Family Medicine

## 2011-08-30 ENCOUNTER — Ambulatory Visit (INDEPENDENT_AMBULATORY_CARE_PROVIDER_SITE_OTHER): Payer: BC Managed Care – PPO | Admitting: Family Medicine

## 2011-08-30 VITALS — BP 110/62 | Temp 98.2°F | Wt 307.0 lb

## 2011-08-30 DIAGNOSIS — S56919A Strain of unspecified muscles, fascia and tendons at forearm level, unspecified arm, initial encounter: Secondary | ICD-10-CM

## 2011-08-30 DIAGNOSIS — IMO0002 Reserved for concepts with insufficient information to code with codable children: Secondary | ICD-10-CM

## 2011-08-30 NOTE — Telephone Encounter (Signed)
Pt called and is unsure which med to take. Which is anti-inflamatory?

## 2011-08-30 NOTE — Telephone Encounter (Signed)
I would go ahead and try the Mobic

## 2011-08-30 NOTE — Progress Notes (Signed)
  Subjective:    Patient ID: Antonio Watts, male    DOB: 1960-09-14, 51 y.o.   MRN: [id number]  HPI  Left forearm pain. No specific injury but job requires heavy gripping sometimes. Duration 6 weeks. Location is extensor muscles near elbow. No visible swelling. No ecchymosis. No numbness or weakness. No shoulder pain. Pain worse with gripping and dorsal extension of wrist  Review of Systems  Neurological: Negative for weakness and numbness.  Hematological: Negative for adenopathy.       Objective:   Physical Exam  Constitutional: He appears well-developed and well-nourished.  Cardiovascular: Normal rate and regular rhythm.   Pulmonary/Chest: Effort normal and breath sounds normal. No respiratory distress. He has no wheezes. He has no rales.  Musculoskeletal:       Left elbow -good range of motion. He has minimal pain with supination/ pronation. No visible swelling. He has tenderness over the extensor muscle forearm and pain with extension of the wrist against resistance but not flexion.          Assessment & Plan:  Extensor muscle strain left forearm. Start back anti-inflammatory. Icing 2-3 times daily. Extension stretches given. Touch base 2-3 weeks if no improvement

## 2011-08-30 NOTE — Telephone Encounter (Signed)
Pt informed

## 2011-08-30 NOTE — Telephone Encounter (Signed)
Pt has a 5 naproxen 500 mg left and is out of the Mobic, however he has refills available.  Pt questioning what antiinflammatory med Dr Caryl Never wanted him to get back on?

## 2011-08-30 NOTE — Patient Instructions (Signed)
Get back on anti inflammatory medication for then next 1-2 weeks Ice affected muscle 2-3 times daily. Be in touch if no better in 2-3 weeks.

## 2011-09-02 ENCOUNTER — Ambulatory Visit: Payer: BC Managed Care – PPO | Admitting: Family Medicine

## 2011-12-17 ENCOUNTER — Other Ambulatory Visit: Payer: Self-pay | Admitting: Family Medicine

## 2012-01-15 ENCOUNTER — Encounter: Payer: Self-pay | Admitting: Family Medicine

## 2012-01-15 ENCOUNTER — Ambulatory Visit (INDEPENDENT_AMBULATORY_CARE_PROVIDER_SITE_OTHER): Payer: BC Managed Care – PPO | Admitting: Family Medicine

## 2012-01-15 VITALS — BP 162/94 | Temp 97.7°F | Ht 69.0 in | Wt 317.0 lb

## 2012-01-15 DIAGNOSIS — R5383 Other fatigue: Secondary | ICD-10-CM

## 2012-01-15 DIAGNOSIS — R5381 Other malaise: Secondary | ICD-10-CM

## 2012-01-15 DIAGNOSIS — R030 Elevated blood-pressure reading, without diagnosis of hypertension: Secondary | ICD-10-CM

## 2012-01-15 DIAGNOSIS — IMO0001 Reserved for inherently not codable concepts without codable children: Secondary | ICD-10-CM

## 2012-01-15 DIAGNOSIS — G4733 Obstructive sleep apnea (adult) (pediatric): Secondary | ICD-10-CM | POA: Insufficient documentation

## 2012-01-15 NOTE — Progress Notes (Signed)
  Subjective:    Patient ID: Antonio Watts, male    DOB: 06/22/1960, 51 y.o.   MRN: [id number]  HPI  Patient is seen with complaints of weight gain and progressive fatigue over several months. He's gained 10 pounds since visit here last summer. He has low libido and decreased stamina. He is specifically concerned about possible low testosterone. History of obstructive sleep apnea and uses CPAP.  He has not had any lab work in about 2 years. No regular exercise. Does have poor dietary compliance. Quit smoking within the past year. Denies depression symptoms. No recent chest pains. No dyspnea. No peripheral edema issues.  Does sometimes have restless sleep even with his CPAP. Generally getting adequate number of hours per sleep at night.  Past Medical History  Diagnosis Date  . OSA (obstructive sleep apnea)   . Kidney stones   . Kidney calculi    No past surgical history on file.  reports that he quit smoking about 7 months ago. His smoking use included Cigarettes. He has a 35 pack-year smoking history. He has never used smokeless tobacco. He reports that he does not drink alcohol or use illicit drugs. family history includes Cancer in an unspecified family member; Heart disease in an unspecified family member; and Heart failure in an unspecified family member. Allergies  Allergen Reactions  . Doxycycline Hyclate     REACTION: rash, swelling      Review of Systems  Constitutional: Positive for fatigue and unexpected weight change. Negative for appetite change.  HENT: Negative for trouble swallowing.   Respiratory: Negative for cough, shortness of breath and wheezing.   Cardiovascular: Negative for chest pain, palpitations and leg swelling.  Gastrointestinal: Negative for abdominal pain.  Neurological: Negative for dizziness and weakness.       Objective:   Physical Exam  Constitutional: He is oriented to person, place, and time. He appears well-developed and well-nourished. No  distress.  Neck: Neck supple. No thyromegaly present.  Cardiovascular: Normal rate and regular rhythm.   Pulmonary/Chest: Effort normal and breath sounds normal. No respiratory distress. He has no wheezes. He has no rales.  Musculoskeletal: He exhibits no edema.  Neurological: He is alert and oriented to person, place, and time. No cranial nerve deficit.  Psychiatric: He has a normal mood and affect. His behavior is normal.          Assessment & Plan:  Patient presents with progressive fatigue and weight gain as well as decreased libido. Order lab work including basic metabolic panel, TSH, total testosterone, CBC and PSA. If all normal, focus on weight loss efforts. Continue CPAP. Discussed dietary issues. Avoid nighttime snacking. Avoid eating with television.  Elevated blood pressure today with no prior history of hypertension. Reassess him in 2 weeks and try monitor with several readings in the meantime over the next couple weeks.

## 2012-01-20 ENCOUNTER — Other Ambulatory Visit: Payer: Self-pay | Admitting: *Deleted

## 2012-01-20 ENCOUNTER — Other Ambulatory Visit (INDEPENDENT_AMBULATORY_CARE_PROVIDER_SITE_OTHER): Payer: BC Managed Care – PPO

## 2012-01-20 DIAGNOSIS — IMO0001 Reserved for inherently not codable concepts without codable children: Secondary | ICD-10-CM

## 2012-01-20 DIAGNOSIS — R5383 Other fatigue: Secondary | ICD-10-CM

## 2012-01-20 DIAGNOSIS — R5381 Other malaise: Secondary | ICD-10-CM

## 2012-01-20 DIAGNOSIS — R030 Elevated blood-pressure reading, without diagnosis of hypertension: Secondary | ICD-10-CM

## 2012-01-20 LAB — CBC WITH DIFFERENTIAL/PLATELET
Basophils Absolute: 0 10*3/uL (ref 0.0–0.1)
Basophils Relative: 0.4 % (ref 0.0–3.0)
Eosinophils Absolute: 0.3 10*3/uL (ref 0.0–0.7)
Eosinophils Relative: 4.8 % (ref 0.0–5.0)
HCT: 45 % (ref 39.0–52.0)
Hemoglobin: 14.4 g/dL (ref 13.0–17.0)
Lymphocytes Relative: 31.7 % (ref 12.0–46.0)
Lymphs Abs: 2.3 10*3/uL (ref 0.7–4.0)
MCHC: 32 g/dL (ref 30.0–36.0)
MCV: 76.8 fl — ABNORMAL LOW (ref 78.0–100.0)
Monocytes Absolute: 0.6 10*3/uL (ref 0.1–1.0)
Monocytes Relative: 7.9 % (ref 3.0–12.0)
Neutro Abs: 4 10*3/uL (ref 1.4–7.7)
Neutrophils Relative %: 55.2 % (ref 43.0–77.0)
Platelets: 248 10*3/uL (ref 150.0–400.0)
RBC: 5.87 Mil/uL — ABNORMAL HIGH (ref 4.22–5.81)
RDW: 16.1 % — ABNORMAL HIGH (ref 11.5–14.6)
WBC: 7.3 10*3/uL (ref 4.5–10.5)

## 2012-01-20 LAB — PSA: PSA: 0.55 ng/mL (ref 0.10–4.00)

## 2012-01-20 LAB — TESTOSTERONE: Testosterone: 144.74 ng/dL — ABNORMAL LOW (ref 350.00–890.00)

## 2012-01-20 LAB — BASIC METABOLIC PANEL
BUN: 14 mg/dL (ref 6–23)
CO2: 27 mEq/L (ref 19–32)
Calcium: 10 mg/dL (ref 8.4–10.5)
Chloride: 107 mEq/L (ref 96–112)
Creatinine, Ser: 1 mg/dL (ref 0.4–1.5)
GFR: 87.23 mL/min (ref >60.00)
Glucose, Bld: 84 mg/dL (ref 70–99)
Potassium: 4.3 mEq/L (ref 3.5–5.1)
Sodium: 140 mEq/L (ref 135–145)

## 2012-01-20 LAB — TSH: TSH: 1.87 u[IU]/mL (ref 0.35–5.50)

## 2012-01-20 MED ORDER — TESTOSTERONE 20.25 MG/ACT (1.62%) TD GEL: 2.0000 | Freq: Every morning | TRANSDERMAL | Status: DC

## 2012-01-20 NOTE — Progress Notes (Signed)
Quick Note:  Pt informed and voiced understanding, Rx called in ______

## 2012-01-24 ENCOUNTER — Telehealth: Payer: Self-pay | Admitting: Family Medicine

## 2012-01-24 NOTE — Telephone Encounter (Signed)
Pt called and has a question re: testosterone gel. Pts insurance is getting ready to lapse.  Pls call.

## 2012-01-24 NOTE — Telephone Encounter (Signed)
This was just filled on 10/28, 75 gm with 1 refill, pt informed on VM controlled med, cannot fill early

## 2012-01-28 ENCOUNTER — Telehealth: Payer: Self-pay | Admitting: Family Medicine

## 2012-01-28 ENCOUNTER — Other Ambulatory Visit: Payer: Self-pay | Admitting: *Deleted

## 2012-01-28 DIAGNOSIS — Z8639 Personal history of other endocrine, nutritional and metabolic disease: Secondary | ICD-10-CM

## 2012-01-28 MED ORDER — TESTOSTERONE 30 MG/ACT TD SOLN: 2.0000 | Freq: Every day | TRANSDERMAL | Status: DC

## 2012-01-28 NOTE — Telephone Encounter (Signed)
Patient's insurance will not cover the Androgel. Patient must first try and fail two of the preferred drugs:  Axiron Fortesta Androderm  Please send in rx for one of the preferred meds to [facility] on [address]. Thank you.

## 2012-01-28 NOTE — Telephone Encounter (Signed)
Start Axiron 30 mg to [facility] axilla once daily in the AM.  We should repeat total testosterone level in 2-4 weeks after starting medication.

## 2012-01-28 NOTE — Telephone Encounter (Signed)
Pt informed on VM, Rx called in, future order placed

## 2012-01-29 ENCOUNTER — Ambulatory Visit (INDEPENDENT_AMBULATORY_CARE_PROVIDER_SITE_OTHER): Payer: BC Managed Care – PPO | Admitting: Family Medicine

## 2012-01-29 ENCOUNTER — Encounter: Payer: Self-pay | Admitting: Family Medicine

## 2012-01-29 VITALS — BP 140/84 | Temp 98.5°F | Wt 314.0 lb

## 2012-01-29 DIAGNOSIS — Z23 Encounter for immunization: Secondary | ICD-10-CM

## 2012-01-29 DIAGNOSIS — E291 Testicular hypofunction: Secondary | ICD-10-CM

## 2012-01-29 DIAGNOSIS — R030 Elevated blood-pressure reading, without diagnosis of hypertension: Secondary | ICD-10-CM

## 2012-01-29 DIAGNOSIS — R7989 Other specified abnormal findings of blood chemistry: Secondary | ICD-10-CM

## 2012-01-29 DIAGNOSIS — IMO0001 Reserved for inherently not codable concepts without codable children: Secondary | ICD-10-CM

## 2012-01-29 MED ORDER — MELOXICAM 15 MG PO TABS: 15.0000 mg | ORAL_TABLET | ORAL | Status: AC | PRN

## 2012-01-29 NOTE — Patient Instructions (Signed)
1. Continue trying to lose weight to lower your blood pressure. 2. Contact Dr. Caryl Never once you are on your new insurance plan and we will decide how to proceed with testosterone replacement.

## 2012-01-29 NOTE — Progress Notes (Signed)
Subjective:     Patient ID: Antonio Watts, male   DOB: [birth year], 51 y.o.   MRN: [id number]  HPI 51 year old male with history of OSA, obesity, and recently diagnosed low testosterone presents today for 2-week follow-up.    He was originally prescribed Androgel 1.62% for low T, but his insurance wouldn't cover it, and so was rx'd Axiron 30mg  instead.  He is changing jobs on 11/18 and will have a 90-day lapse in insurance, so has not filled this yet, as he would have to stop taking it while uninsured.    Has not been checking BPs at home since last visit, still borderline high today.  Pt agrees that he would prefer losing weight as a way to lower BP.  Pt also would like a flu shot today, as he is about to be uninsured for 3 months during flu season.    Review of Systems  Constitutional: Positive for fatigue.       Objective:   Physical Exam  Vitals reviewed. Cardiovascular: Normal rate and regular rhythm.   Musculoskeletal: He exhibits no edema.  BP 140/84, rechecked by MD at 144/90.     Assessment:     51 year old male with history of OSA, obesity and recently diagnosed low T in for 2-week follow-up.    Plan:     1. Low T: has not filled Axiron Rx yet, as he does not want to start and then stop while he will be uninsured for 3 months.  Agree that it would be best to wait until new insurance starts to begin testosterone replacement therapy.   Pt will contact when he is covered under new job's insurance policy. 2. Elevated BP: encourage weight loss and home monitoring of BPs.  Re-check at next visit. 3. Flu shot given today. 4. Refill Mobic for elbow pain and tightness.     Agree with assessment and plan as per Marthann Schiller, MS 3 Evelena Peat, MD

## 2012-01-30 ENCOUNTER — Telehealth: Payer: Self-pay | Admitting: Family Medicine

## 2012-01-30 DIAGNOSIS — G4733 Obstructive sleep apnea (adult) (pediatric): Secondary | ICD-10-CM

## 2012-01-30 NOTE — Telephone Encounter (Signed)
Please sign order

## 2012-01-30 NOTE — Telephone Encounter (Signed)
signed

## 2012-01-30 NOTE — Telephone Encounter (Signed)
Will have front staff fax Rx.

## 2012-01-30 NOTE — Telephone Encounter (Signed)
The medical supplier this patient was going through for his CPAP machine and mask no longer deals w/his insuance, [employer]. Pt is requesting Dr. Caryl Never write a prescription for "CPAP supplies" - that's what the supplier wants it to say. Please put dx on it as well. Please fax rx to [facility] @ [phone] Attn: Dianna Rossetti. Thank you.

## 2012-02-04 ENCOUNTER — Telehealth: Payer: Self-pay | Admitting: *Deleted

## 2012-02-04 NOTE — Telephone Encounter (Signed)
Pt called reporting he was given an opportunity to stay with his job, so no lapse in insurance and plans to begin his testosterone therapy and re-check levels in 3 months.  FYI

## 2012-02-12 ENCOUNTER — Telehealth: Payer: Self-pay | Admitting: Family Medicine

## 2012-02-12 DIAGNOSIS — Z8639 Personal history of other endocrine, nutritional and metabolic disease: Secondary | ICD-10-CM

## 2012-02-12 NOTE — Telephone Encounter (Signed)
Pt did not change insurance companies, therefore he started the testosterone therapy last week, 11/13. He would like to know when you want him to come in for his blood work. His phone [phone].

## 2012-02-12 NOTE — Telephone Encounter (Signed)
Within 2-4 weeks of starting therapy.

## 2012-02-12 NOTE — Telephone Encounter (Signed)
Pt informed and will call for early lab appt

## 2012-03-03 ENCOUNTER — Other Ambulatory Visit (INDEPENDENT_AMBULATORY_CARE_PROVIDER_SITE_OTHER): Payer: BC Managed Care – PPO

## 2012-03-03 DIAGNOSIS — Z862 Personal history of diseases of the blood and blood-forming organs and certain disorders involving the immune mechanism: Secondary | ICD-10-CM

## 2012-03-03 DIAGNOSIS — Z8639 Personal history of other endocrine, nutritional and metabolic disease: Secondary | ICD-10-CM

## 2012-03-03 LAB — TESTOSTERONE: Testosterone: 541.59 ng/dL (ref 350.00–890.00)

## 2012-03-04 NOTE — Progress Notes (Signed)
Quick Note:  Pt informed ______ 

## 2012-03-09 ENCOUNTER — Other Ambulatory Visit: Payer: Self-pay | Admitting: Family Medicine

## 2012-03-10 NOTE — Telephone Encounter (Signed)
axiron last filled 01-28-12, 90 ml with 0 refills

## 2012-03-10 NOTE — Telephone Encounter (Signed)
Refill for 6 months. 

## 2012-03-30 ENCOUNTER — Ambulatory Visit (INDEPENDENT_AMBULATORY_CARE_PROVIDER_SITE_OTHER): Payer: BC Managed Care – PPO | Admitting: Family Medicine

## 2012-03-30 ENCOUNTER — Encounter: Payer: Self-pay | Admitting: Family Medicine

## 2012-03-30 VITALS — BP 152/98 | HR 72 | Temp 98.6°F | Resp 12 | Ht 68.75 in | Wt 324.0 lb

## 2012-03-30 DIAGNOSIS — Z299 Encounter for prophylactic measures, unspecified: Secondary | ICD-10-CM

## 2012-03-30 DIAGNOSIS — R319 Hematuria, unspecified: Secondary | ICD-10-CM

## 2012-03-30 DIAGNOSIS — Z0000 Encounter for general adult medical examination without abnormal findings: Secondary | ICD-10-CM

## 2012-03-30 DIAGNOSIS — I10 Essential (primary) hypertension: Secondary | ICD-10-CM

## 2012-03-30 LAB — POCT URINALYSIS DIPSTICK
Bilirubin, UA: NEGATIVE
Glucose, UA: NEGATIVE
Ketones, UA: NEGATIVE
Leukocytes, UA: NEGATIVE
Nitrite, UA: NEGATIVE
Protein, UA: NEGATIVE
Spec Grav, UA: 1.015
Urobilinogen, UA: 0.2
pH, UA: 7

## 2012-03-30 MED ORDER — LISINOPRIL 10 MG PO TABS: 10.0000 mg | ORAL_TABLET | Freq: Every day | ORAL | Status: DC

## 2012-03-30 NOTE — Progress Notes (Signed)
  Subjective:    Patient ID: Antonio Watts, male    DOB: 04-May-1960, 52 y.o.   MRN: [id number]  HPI Patient's here for physical. His past medical history is significant for obesity, low testosterone, obstructive sleep apnea, and nicotine use. He recently quit smoking about a year ago. Had significant weight gain since then. Poor dietary habits. He frequently drinks Kool-Aid. Very sedentary. History of chronic low back pain which limits some of his exercise. Went on testosterone replacement topically recently. Had slight increase in energy but not what he hoped for.  Colonoscopy at age 25 normal. Tetanus up-to-date. Influenza up-to-date.  Past Medical History  Diagnosis Date  . OSA (obstructive sleep apnea)   . Kidney stones   . Kidney calculi    No past surgical history on file.  reports that he quit smoking about 10 months ago. His smoking use included Cigarettes. He has a 35 pack-year smoking history. He has never used smokeless tobacco. He reports that he does not drink alcohol or use illicit drugs. family history includes Cancer in an unspecified family member; Heart disease in an unspecified family member; and Heart failure in an unspecified family member. Allergies  Allergen Reactions  . Doxycycline Hyclate     REACTION: rash, swelling      Review of Systems  Constitutional: Positive for fever and unexpected weight change. Negative for activity change, appetite change and fatigue.  HENT: Negative for ear pain, congestion and trouble swallowing.   Eyes: Negative for pain and visual disturbance.  Respiratory: Negative for cough, shortness of breath and wheezing.   Cardiovascular: Negative for chest pain and palpitations.  Gastrointestinal: Negative for nausea, vomiting, abdominal pain, diarrhea, constipation, blood in stool, abdominal distention and rectal pain.  Genitourinary: Negative for dysuria, hematuria and testicular pain.  Musculoskeletal: Positive for back pain.  Negative for joint swelling and arthralgias.  Skin: Negative for rash.  Neurological: Negative for dizziness, syncope and headaches.  Hematological: Negative for adenopathy.  Psychiatric/Behavioral: Negative for confusion and dysphoric mood.       Objective:   Physical Exam  Constitutional: He is oriented to person, place, and time. He appears well-developed and well-nourished.  HENT:  Right Ear: External ear normal.  Left Ear: External ear normal.  Mouth/Throat: Oropharynx is clear and moist.  Neck: Neck supple. No thyromegaly present.  Cardiovascular: Normal rate and regular rhythm.  Exam reveals no gallop.   Pulmonary/Chest: Effort normal and breath sounds normal. No respiratory distress. He has no wheezes. He has no rales.  Abdominal: Soft. Bowel sounds are normal. He exhibits no distension and no mass. There is no tenderness. There is no rebound and no guarding.  Genitourinary:       No hernia.  Musculoskeletal: He exhibits no edema.  Lymphadenopathy:    He has no cervical adenopathy.  Neurological: He is alert and oriented to person, place, and time. A cranial nerve deficit is present.  Skin: No rash noted.  Psychiatric: He has a normal mood and affect.          Assessment & Plan:  Health maintenance. Patient needs to lose substantial weight. Nutrition referral  Hypertension. Currently untreated. Initiate lisinopril 10 mg daily and reassess blood pressure in 3-4 weeks.  Hematuria on urine dipstick. Recheck at followup visit and send for urine micro if urine dipstick still positive.

## 2012-03-31 ENCOUNTER — Telehealth: Payer: Self-pay | Admitting: Family Medicine

## 2012-03-31 NOTE — Telephone Encounter (Signed)
[name]) calling for pt to verify procedure diagnosis codes in order to confirm his coverage. Please call and ask for [name].

## 2012-04-01 NOTE — Telephone Encounter (Signed)
Obesity, Obstructive Sleep Apnea, and hypertension.

## 2012-04-01 NOTE — Telephone Encounter (Signed)
Antonio Watts, I tried calling [employer] back at number with no success.  Can you help me get this information to them please?

## 2012-04-01 NOTE — Telephone Encounter (Signed)
[employer] needs a procedure diagnosis code for I believe the nutrition referral.  Obesity?

## 2012-04-02 NOTE — Telephone Encounter (Signed)
Called number given talked to several people and no one could help me and no one knew who Glee Arvin is.

## 2012-04-02 NOTE — Telephone Encounter (Signed)
Noted  

## 2012-04-13 ENCOUNTER — Ambulatory Visit: Payer: BC Managed Care – PPO | Admitting: *Deleted

## 2012-04-20 ENCOUNTER — Encounter: Payer: Self-pay | Admitting: Family Medicine

## 2012-04-20 ENCOUNTER — Ambulatory Visit (INDEPENDENT_AMBULATORY_CARE_PROVIDER_SITE_OTHER): Payer: BC Managed Care – PPO | Admitting: Family Medicine

## 2012-04-20 VITALS — BP 140/90 | Temp 99.0°F | Wt 320.0 lb

## 2012-04-20 DIAGNOSIS — I10 Essential (primary) hypertension: Secondary | ICD-10-CM

## 2012-04-20 DIAGNOSIS — R319 Hematuria, unspecified: Secondary | ICD-10-CM

## 2012-04-20 LAB — POCT URINALYSIS DIPSTICK
Bilirubin, UA: NEGATIVE
Glucose, UA: NEGATIVE
Ketones, UA: NEGATIVE
Leukocytes, UA: NEGATIVE
Nitrite, UA: NEGATIVE
Spec Grav, UA: 1.025
Urobilinogen, UA: 0.2
pH, UA: 6

## 2012-04-20 NOTE — Patient Instructions (Addendum)
Continue with weight loss efforts. Let's plan on rechecking blood pressure in about 3 months.

## 2012-04-20 NOTE — Progress Notes (Signed)
  Subjective:    Patient ID: Antonio Watts, male    DOB: [birth year], 52 y.o.   MRN: [id number]  HPI Patient seen for followup regarding hypertension. Started lisinopril 10 mg daily. Denies side effects such as cough or dizziness. Compliant with therapy. Has made some lifestyle changes. Lost 4 pounds since last visit  Patient had hematuria on urine dipstick. No gross hematuria. No dysuria. Remote history of kidney stones. No appetite changes. Intentional weight loss as above  Past Medical History  Diagnosis Date  . OSA (obstructive sleep apnea)   . Kidney stones   . Kidney calculi    No past surgical history on file.  reports that he quit smoking about 10 months ago. His smoking use included Cigarettes. He has a 35 pack-year smoking history. He has never used smokeless tobacco. He reports that he does not drink alcohol or use illicit drugs. family history includes Cancer in an unspecified family member; Heart disease in an unspecified family member; and Heart failure in an unspecified family member. Allergies  Allergen Reactions  . Doxycycline Hyclate     REACTION: rash, swelling      Review of Systems  Constitutional: Negative for fever, chills, appetite change and fatigue.  Eyes: Negative for visual disturbance.  Respiratory: Negative for cough, chest tightness and shortness of breath.   Cardiovascular: Negative for chest pain, palpitations and leg swelling.  Neurological: Negative for dizziness, syncope, weakness, light-headedness and headaches.       Objective:   Physical Exam  Constitutional: He appears well-developed and well-nourished.  Cardiovascular: Normal rate and regular rhythm.   Pulmonary/Chest: Effort normal and breath sounds normal. No respiratory distress. He has no wheezes. He has no rales.          Assessment & Plan:  #1 hypertension. Improving. Continue weight loss efforts. Reassess in 3 months. We feel he can manage his hypertension with  additional weight loss without adding more medication #2 hematuria on urine dipstick. Persistent today. Send for urine micro-

## 2012-04-21 LAB — URINALYSIS, MICROSCOPIC ONLY
Bacteria, UA: NONE SEEN
Casts: NONE SEEN
Crystals: NONE SEEN
Squamous Epithelial / LPF: NONE SEEN

## 2012-04-21 NOTE — Progress Notes (Signed)
Quick Note:  Pt informed on VM ______ 

## 2012-04-27 ENCOUNTER — Ambulatory Visit: Payer: BC Managed Care – PPO | Admitting: *Deleted

## 2012-05-27 ENCOUNTER — Telehealth: Payer: Self-pay | Admitting: Family Medicine

## 2012-05-27 NOTE — Telephone Encounter (Signed)
Patient Information:  Caller Name: Tayvian  Phone: [phone]  Patient: Antonio, Watts  Gender: Male  DOB: [birth year]  Age: 52 Years  PCP: Evelena Peat [facility])  Office Follow Up:  Does the office need to follow up with this patient?: No  Instructions For The Office: N/A  RN Note:  Started axiron 3-4 months ago and is considering stopping the medication.  Wants to know if it would be safe to stop the medication.  Some are more tender than others.  No symptoms of infection.  Per localized rash protocol, emergent symptoms denied; advised and offered appt 05/27/12 and 05/28/12.  Patient declines both appts; states will call back when he can check his work schedule and will schedule appt.  krs/can  Symptoms  Reason For Call & Symptoms: rash/acne under right arm and near shoulder for one month; started Axiron 3-4 months ago.  Also concerned about heart attacks based on advertisement.  States it is expensive as well.  Reviewed Health History In EMR: Yes  Reviewed Medications In EMR: Yes  Reviewed Allergies In EMR: Yes  Reviewed Surgeries / Procedures: Yes  Date of Onset of Symptoms: 04/29/2012  Guideline(s) Used:  Rash or Redness - Localized  Disposition Per Guideline:   See Today or Tomorrow in Office  Reason For Disposition Reached:   Pimples (localized) and no improvement after using Care Advice  Advice Given:  N/A  Patient Refused Recommendation:  Patient Will Follow Up With Office Later  will check schedule and call back for appt krs/can

## 2012-05-28 ENCOUNTER — Ambulatory Visit (INDEPENDENT_AMBULATORY_CARE_PROVIDER_SITE_OTHER): Payer: BC Managed Care – PPO | Admitting: Family Medicine

## 2012-05-28 ENCOUNTER — Encounter: Payer: Self-pay | Admitting: Family Medicine

## 2012-05-28 VITALS — BP 138/88 | Temp 97.8°F | Wt 317.0 lb

## 2012-05-28 DIAGNOSIS — L678 Other hair color and hair shaft abnormalities: Secondary | ICD-10-CM

## 2012-05-28 DIAGNOSIS — L738 Other specified follicular disorders: Secondary | ICD-10-CM

## 2012-05-28 DIAGNOSIS — L739 Follicular disorder, unspecified: Secondary | ICD-10-CM

## 2012-05-28 MED ORDER — CEPHALEXIN 500 MG PO CAPS: 500.0000 mg | ORAL_CAPSULE | Freq: Three times a day (TID) | ORAL | Status: DC

## 2012-05-28 NOTE — Patient Instructions (Signed)
Folliculitis   Folliculitis is redness, soreness, and swelling (inflammation) of the hair follicles. This condition can occur anywhere on the body. People with weakened immune systems, diabetes, or obesity have a greater risk of getting folliculitis.  CAUSES   Bacterial infection. This is the most common cause.   Fungal infection.   Viral infection.   Contact with certain chemicals, especially oils and tars.  Long-term folliculitis can result from bacteria that live in the nostrils. The bacteria may trigger multiple outbreaks of folliculitis over time.  SYMPTOMS  Folliculitis most commonly occurs on the scalp, thighs, legs, back, buttocks, and areas where hair is shaved frequently. An early sign of folliculitis is a small, white or yellow, pus-filled, itchy lesion (pustule). These lesions appear on a red, inflamed follicle. They are usually less than 0.2 inches (5 mm) wide. When there is an infection of the follicle that goes deeper, it becomes a boil or furuncle. A group of closely packed boils creates a larger lesion (carbuncle). Carbuncles tend to occur in hairy, sweaty areas of the body.  DIAGNOSIS   Your caregiver can usually tell what is wrong by doing a physical exam. A sample may be taken from one of the lesions and tested in a lab. This can help determine what is causing your folliculitis.  TREATMENT   Treatment may include:   Applying warm compresses to the affected areas.   Taking antibiotic medicines orally or applying them to the skin.   Draining the lesions if they contain a large amount of pus or fluid.   Laser hair removal for cases of long-lasting folliculitis. This helps to prevent regrowth of the hair.  HOME CARE INSTRUCTIONS   Apply warm compresses to the affected areas as directed by your caregiver.   If antibiotics are prescribed, take them as directed. Finish them even if you start to feel better.   You may take over-the-counter medicines to relieve itching.   Do not shave  irritated skin.   Follow up with your caregiver as directed.  SEEK IMMEDIATE MEDICAL CARE IF:    You have increasing redness, swelling, or pain in the affected area.   You have a fever.  MAKE SURE YOU:   Understand these instructions.   Will watch your condition.   Will get help right away if you are not doing well or get worse.  Document Released: 05/20/2001 Document Revised: 09/10/2011 Document Reviewed: 06/11/2011  [employer] Patient Information 2013 [employer].

## 2012-05-28 NOTE — Progress Notes (Signed)
  Subjective:    Patient ID: Antonio Watts, male    DOB: 1961-01-28, 52 y.o.   MRN: [id number]  HPI Acute  Rash bilateral axillary and lateral chest wall. Nonpainful (except mild soreness) and only occasional mild itch.   No fever.  Present for 2-3 weeks No alleviating factors.   No generalized rash.  Allergic to Doxycycline.  Past Medical History  Diagnosis Date  . OSA (obstructive sleep apnea)   . Kidney stones   . Kidney calculi    No past surgical history on file.  reports that he quit smoking about a year ago. His smoking use included Cigarettes. He has a 35 pack-year smoking history. He has never used smokeless tobacco. He reports that he does not drink alcohol or use illicit drugs. family history includes Cancer in an unspecified family member; Heart disease in an unspecified family member; and Heart failure in an unspecified family member. Allergies  Allergen Reactions  . Doxycycline Hyclate     REACTION: rash, swelling      Review of Systems  Constitutional: Negative for fever and chills.  Skin: Positive for rash.  Hematological: Negative for adenopathy.       Objective:   Physical Exam  Constitutional: He appears well-developed and well-nourished.  Cardiovascular: Normal rate and regular rhythm.   Pulmonary/Chest: Effort normal and breath sounds normal. No respiratory distress. He has no wheezes. He has no rales.  Skin: Rash noted.  Small 1-2 mm erythematous papules with white center. Non tender.  Several scattered axilla and lateral chest wall.          Assessment & Plan:  Folliculitis.  Use antibacterial soap such as Lever 2000 or Dial.  Keflex 500 mg po tid for 10 days.

## 2013-02-19 ENCOUNTER — Encounter: Payer: Self-pay | Admitting: Family Medicine

## 2013-02-19 ENCOUNTER — Ambulatory Visit (INDEPENDENT_AMBULATORY_CARE_PROVIDER_SITE_OTHER): Payer: BC Managed Care – PPO | Admitting: Family Medicine

## 2013-02-19 VITALS — BP 138/78 | HR 64 | Temp 98.0°F | Wt 309.0 lb

## 2013-02-19 DIAGNOSIS — M7710 Lateral epicondylitis, unspecified elbow: Secondary | ICD-10-CM

## 2013-02-19 DIAGNOSIS — M7711 Lateral epicondylitis, right elbow: Secondary | ICD-10-CM

## 2013-02-19 MED ORDER — MELOXICAM 15 MG PO TABS: 15.0000 mg | ORAL_TABLET | Freq: Every day | ORAL | Status: DC

## 2013-02-19 NOTE — Progress Notes (Signed)
   Subjective:    Patient ID: Antonio Watts, male    DOB: Oct 20, 1960, 52 y.o.   MRN: [id number]  HPI Patient is seen with chief complaint of right elbow pain. Location is lateral. Similar episode in the past. Work requires lots of lifting and gripping. No specific injury. He describes a soreness which is worse with gripping. Sometimes notices pain at night when rolling over on the elbow. His tried heat but not ice. He denies any upper extremity weakness. No neck pain. He is requesting refills of meloxicam which is taken as anti-inflammatory in the past  Past Medical History  Diagnosis Date  . OSA (obstructive sleep apnea)   . Kidney stones   . Kidney calculi   . Low testosterone    No past surgical history on file.  reports that he quit smoking about 20 months ago. His smoking use included Cigarettes. He has a 35 pack-year smoking history. He has never used smokeless tobacco. He reports that he does not drink alcohol or use illicit drugs. family history includes Cancer in an other family member; Heart disease in an other family member; Heart failure in an other family member. Allergies  Allergen Reactions  . Doxycycline Hyclate     REACTION: rash, swelling      Review of Systems  Neurological: Negative for weakness and numbness.       Objective:   Physical Exam  Constitutional: He appears well-developed and well-nourished.  Cardiovascular: Normal rate and regular rhythm.   Pulmonary/Chest: Effort normal and breath sounds normal. No respiratory distress. He has no wheezes. He has no rales.  Musculoskeletal:  Right elbow reveals no edema. No ecchymosis. Tenderness over the lateral epicondylar region. Full range of motion. No biceps tenderness  Neurological:  No focal weakness. Symmetric reflexes.          Assessment & Plan:  Right lateral epicondylitis. Recommend icing, especially after activities. Refill meloxicam for as needed use. Exercises and stretches given.  Consider corticosteroid injection not improving with the above

## 2013-02-19 NOTE — Patient Instructions (Signed)
Lateral Epicondylitis (Tennis Elbow) with Rehab Lateral epicondylitis involves inflammation and pain around the outer portion of the elbow. The pain is caused by inflammation of the tendons in the forearm that bring back (extend) the wrist. Lateral epicondylittis is also called tennis elbow, because it is very common in tennis players. However, it may occur in any individual who extends the wrist repetitively. If lateral epicondylitis is left untreated, it may become a chronic problem. SYMPTOMS   Pain, tenderness, and inflammation on the outer (lateral) side of the elbow.  Pain or weakness with gripping activities.  Pain that increases with wrist twisting motions (playing tennis, using a screwdriver, opening a door or a jar).  Pain with lifting objects, including a coffee cup. CAUSES  Lateral epicondylitis is caused by inflammation of the tendons that extend the wrist. Causes of injury may include:  Repetitive stress and strain on the muscles and tendons that extend the wrist.  Sudden change in activity level or intensity.  Incorrect grip in racquet sports.  Incorrect grip size of racquet (often too large).  Incorrect hitting position or technique (usually backhand, leading with the elbow).  Using a racket that is too heavy. RISK INCREASES WITH:  Sports or occupations that require repetitive and/or strenuous forearm and wrist movements (tennis, squash, racquetball, carpentry).  Poor wrist and forearm strength and flexibility.  Failure to warm up properly before activity.  Resuming activity before healing, rehabilitation, and conditioning are complete. PREVENTION   Warm up and stretch properly before activity.  Maintain physical fitness:  Strength, flexibility, and endurance.  Cardiovascular fitness.  Wear and use properly fitted equipment.  Learn and use proper technique and have a coach correct improper technique.  Wear a tennis elbow (counterforce) brace. PROGNOSIS   The course of this condition depends on the degree of the injury. If treated properly, acute cases (symptoms lasting less than 4 weeks) are often resolved in 2 to 6 weeks. Chronic (longer lasting cases) often resolve in 3 to 6 months, but may require physical therapy. RELATED COMPLICATIONS   Frequently recurring symptoms, resulting in a chronic problem. Properly treating the problem the first time decreases frequency of recurrence.  Chronic inflammation, scarring tendon degeneration, and partial tendon tear, requiring surgery.  Delayed healing or resolution of symptoms. TREATMENT  Treatment first involves the use of ice and medicine, to reduce pain and inflammation. Strengthening and stretching exercises may help reduce discomfort, if performed regularly. These exercises may be performed at home, if the condition is an acute injury. Chronic cases may require a referral to a physical therapist for evaluation and treatment. Your caregiver may advise a corticosteroid injection, to help reduce inflammation. Rarely, surgery is needed. MEDICATION  If pain medicine is needed, nonsteroidal anti-inflammatory medicines (aspirin and ibuprofen), or other minor pain relievers (acetaminophen), are often advised.  Do not take pain medicine for 7 days before surgery.  Prescription pain relievers may be given, if your caregiver thinks they are needed. Use only as directed and only as much as you need.  Corticosteroid injections may be recommended. These injections should be reserved only for the most severe cases, because they can only be given a certain number of times. HEAT AND COLD  Cold treatment (icing) should be applied for 10 to 15 minutes every 2 to 3 hours for inflammation and pain, and immediately after activity that aggravates your symptoms. Use ice packs or an ice massage.  Heat treatment may be used before performing stretching and strengthening activities prescribed by your   caregiver, physical  therapist, or athletic trainer. Use a heat pack or a warm water soak. SEEK MEDICAL CARE IF: Symptoms get worse or do not improve in 2 weeks, despite treatment. EXERCISES  RANGE OF MOTION (ROM) AND STRETCHING EXERCISES - Epicondylitis, Lateral (Tennis Elbow) These exercises may help you when beginning to rehabilitate your injury. Your symptoms may go away with or without further involvement from your physician, physical therapist or athletic trainer. While completing these exercises, remember:   Restoring tissue flexibility helps normal motion to return to the joints. This allows healthier, less painful movement and activity.  An effective stretch should be held for at least 30 seconds.  A stretch should never be painful. You should only feel a gentle lengthening or release in the stretched tissue. RANGE OF MOTION  Wrist Flexion, Active-Assisted  Extend your right / left elbow with your fingers pointing down.*  Gently pull the back of your hand towards you, until you feel a gentle stretch on the top of your forearm.  Hold this position for __________ seconds. Repeat __________ times. Complete this exercise __________ times per day.  *If directed by your physician, physical therapist or athletic trainer, complete this stretch with your elbow bent, rather than extended. RANGE OF MOTION  Wrist Extension, Active-Assisted  Extend your right / left elbow and turn your palm upwards.*  Gently pull your palm and fingertips back, so your wrist extends and your fingers point more toward the ground.  You should feel a gentle stretch on the inside of your forearm.  Hold this position for __________ seconds. Repeat __________ times. Complete this exercise __________ times per day. *If directed by your physician, physical therapist or athletic trainer, complete this stretch with your elbow bent, rather than extended. STRETCH - Wrist Flexion  Place the back of your right / left hand on a tabletop,  leaving your elbow slightly bent. Your fingers should point away from your body.  Gently press the back of your hand down onto the table by straightening your elbow. You should feel a stretch on the top of your forearm.  Hold this position for __________ seconds. Repeat __________ times. Complete this stretch __________ times per day.  STRETCH  Wrist Extension   Place your right / left fingertips on a tabletop, leaving your elbow slightly bent. Your fingers should point backwards.  Gently press your fingers and palm down onto the table by straightening your elbow. You should feel a stretch on the inside of your forearm.  Hold this position for __________ seconds. Repeat __________ times. Complete this stretch __________ times per day.  STRENGTHENING EXERCISES - Epicondylitis, Lateral (Tennis Elbow) These exercises may help you when beginning to rehabilitate your injury. They may resolve your symptoms with or without further involvement from your physician, physical therapist or athletic trainer. While completing these exercises, remember:   Muscles can gain both the endurance and the strength needed for everyday activities through controlled exercises.  Complete these exercises as instructed by your physician, physical therapist or athletic trainer. Increase the resistance and repetitions only as guided.  You may experience muscle soreness or fatigue, but the pain or discomfort you are trying to eliminate should never worsen during these exercises. If this pain does get worse, stop and make sure you are following the directions exactly. If the pain is still present after adjustments, discontinue the exercise until you can discuss the trouble with your caregiver. STRENGTH Wrist Flexors  Sit with your right / left forearm palm-up and   fully supported on a table or countertop. Your elbow should be resting below the height of your shoulder. Allow your wrist to extend over the edge of the  surface.  Loosely holding a __________ weight, or a piece of rubber exercise band or tubing, slowly curl your hand up toward your forearm.  Hold this position for __________ seconds. Slowly lower the wrist back to the starting position in a controlled manner. Repeat __________ times. Complete this exercise __________ times per day.  STRENGTH  Wrist Extensors  Sit with your right / left forearm palm-down and fully supported on a table or countertop. Your elbow should be resting below the height of your shoulder. Allow your wrist to extend over the edge of the surface.  Loosely holding a __________ weight, or a piece of rubber exercise band or tubing, slowly curl your hand up toward your forearm.  Hold this position for __________ seconds. Slowly lower the wrist back to the starting position in a controlled manner. Repeat __________ times. Complete this exercise __________ times per day.  STRENGTH - Ulnar Deviators  Stand with a ____________________ weight in your right / left hand, or sit while holding a rubber exercise band or tubing, with your healthy arm supported on a table or countertop.  Move your wrist, so that your pinkie travels toward your forearm and your thumb moves away from your forearm.  Hold this position for __________ seconds and then slowly lower the wrist back to the starting position. Repeat __________ times. Complete this exercise __________ times per day STRENGTH - Radial Deviators  Stand with a ____________________ weight in your right / left hand, or sit while holding a rubber exercise band or tubing, with your injured arm supported on a table or countertop.  Raise your hand upward in front of you or pull up on the rubber tubing.  Hold this position for __________ seconds and then slowly lower the wrist back to the starting position. Repeat __________ times. Complete this exercise __________ times per day. STRENGTH  Forearm Supinators   Sit with your right /  left forearm supported on a table, keeping your elbow below shoulder height. Rest your hand over the edge, palm down.  Gently grip a hammer or a soup ladle.  Without moving your elbow, slowly turn your palm and hand upward to a "thumbs-up" position.  Hold this position for __________ seconds. Slowly return to the starting position. Repeat __________ times. Complete this exercise __________ times per day.  STRENGTH  Forearm Pronators   Sit with your right / left forearm supported on a table, keeping your elbow below shoulder height. Rest your hand over the edge, palm up.  Gently grip a hammer or a soup ladle.  Without moving your elbow, slowly turn your palm and hand upward to a "thumbs-up" position.  Hold this position for __________ seconds. Slowly return to the starting position. Repeat __________ times. Complete this exercise __________ times per day.  STRENGTH - Grip  Grasp a tennis ball, a dense sponge, or a large, rolled sock in your hand.  Squeeze as hard as you can, without increasing any pain.  Hold this position for __________ seconds. Release your grip slowly. Repeat __________ times. Complete this exercise __________ times per day.  STRENGTH - Elbow Extensors, Isometric  Stand or sit upright, on a firm surface. Place your right / left arm so that your palm faces your stomach, and it is at the height of your waist.  Place your opposite hand on   the underside of your forearm. Gently push up as your right / left arm resists. Push as hard as you can with both arms, without causing any pain or movement at your right / left elbow. Hold this stationary position for __________ seconds. Gradually release the tension in both arms. Allow your muscles to relax completely before repeating. Document Released: 03/11/2005 Document Revised: 06/03/2011 Document Reviewed: 06/23/2008 [employer] Patient Information 2014 [employer].  

## 2013-04-14 ENCOUNTER — Other Ambulatory Visit: Payer: Self-pay | Admitting: Family Medicine

## 2013-04-28 ENCOUNTER — Encounter: Payer: Self-pay | Admitting: Family Medicine

## 2013-04-28 ENCOUNTER — Ambulatory Visit (INDEPENDENT_AMBULATORY_CARE_PROVIDER_SITE_OTHER): Payer: BC Managed Care – PPO | Admitting: Family Medicine

## 2013-04-28 VITALS — BP 134/66 | HR 72 | Temp 97.7°F | Wt 319.0 lb

## 2013-04-28 DIAGNOSIS — J329 Chronic sinusitis, unspecified: Secondary | ICD-10-CM

## 2013-04-28 MED ORDER — AMOXICILLIN-POT CLAVULANATE 875-125 MG PO TABS: 1.0000 | ORAL_TABLET | Freq: Two times a day (BID) | ORAL | Status: DC

## 2013-04-28 NOTE — Progress Notes (Signed)
Pre visit review using our clinic review tool, if applicable. No additional management support is needed unless otherwise documented below in the visit note. 

## 2013-04-28 NOTE — Patient Instructions (Signed)

## 2013-04-28 NOTE — Progress Notes (Signed)
   Subjective:    Patient ID: Antonio Watts, male    DOB: 12/04/1960, 53 y.o.   MRN: [id number]  Cough Pertinent negatives include no chills or fever.   Acute visit Patient seen with 2 week history of progressive sinus congestion, yellow to bloody nasal discharge, intermittent headaches. He developed dry cough for the past few days. Increased malaise. He's tried nasal saline irrigation and over-the-counter medications without much improvement. He has hypertension 2 lisinopril. Quit smoking a few years ago.  Past Medical History  Diagnosis Date  . OSA (obstructive sleep apnea)   . Kidney stones   . Kidney calculi   . Low testosterone    No past surgical history on file.  reports that he quit smoking about 23 months ago. His smoking use included Cigarettes. He has a 35 pack-year smoking history. He has never used smokeless tobacco. He reports that he does not drink alcohol or use illicit drugs. family history includes Cancer in an other family member; Heart disease in an other family member; Heart failure in an other family member. Allergies  Allergen Reactions  . Doxycycline Hyclate     REACTION: rash, swelling      Review of Systems  Constitutional: Positive for fatigue. Negative for fever and chills.  HENT: Positive for congestion and sinus pressure. Negative for voice change.   Respiratory: Positive for cough.        Objective:   Physical Exam  Constitutional: He appears well-developed and well-nourished.  HENT:  Right Ear: External ear normal.  Left Ear: External ear normal.  Mouth/Throat: Oropharynx is clear and moist.  Neck: Neck supple.  Cardiovascular: Normal rate.   Pulmonary/Chest: Effort normal and breath sounds normal. No respiratory distress. He has no wheezes. He has no rales.  Lymphadenopathy:    He has no cervical adenopathy.          Assessment & Plan:  Acute sinusitis. Augmentin 875 mg twice daily for 10 days. Stay well hydrated. Continue nasal  saline irrigation

## 2013-07-26 ENCOUNTER — Ambulatory Visit (INDEPENDENT_AMBULATORY_CARE_PROVIDER_SITE_OTHER): Payer: BC Managed Care – PPO | Admitting: Family Medicine

## 2013-07-26 ENCOUNTER — Encounter: Payer: Self-pay | Admitting: Family Medicine

## 2013-07-26 VITALS — BP 136/82 | HR 70 | Temp 98.6°F | Wt 314.0 lb

## 2013-07-26 DIAGNOSIS — H8110 Benign paroxysmal vertigo, unspecified ear: Secondary | ICD-10-CM

## 2013-07-26 NOTE — Patient Instructions (Signed)

## 2013-07-26 NOTE — Progress Notes (Signed)
   Subjective:    Patient ID: Antonio Watts, male    DOB: 12/13/1960, 53 y.o.   MRN: [id number]  Dizziness Pertinent negatives include no chest pain, chills, coughing, fever, headaches or weakness.   Acute visit for dizziness. Patient's had couple weeks of episodes of dizziness which occur early in the morning using when turning over in bed. Mostly when turning from right to left side. He describes vertigo symptoms which are very transient lasting only a few seconds. No hearing changes. No nausea or vomiting. No focal weakness. No speech changes. No swallowing difficulties. No ataxia. His symptoms actually improve with getting up and moving around. No nasal congestive symptoms. No alleviating factors. Exacerbation by movement. No headaches.  Past Medical History  Diagnosis Date  . OSA (obstructive sleep apnea)   . Kidney stones   . Kidney calculi   . Low testosterone    No past surgical history on file.  reports that he quit smoking about 2 years ago. His smoking use included Cigarettes. He has a 35 pack-year smoking history. He has never used smokeless tobacco. He reports that he does not drink alcohol or use illicit drugs. family history includes Cancer in an other family member; Heart disease in an other family member; Heart failure in an other family member. Allergies  Allergen Reactions  . Doxycycline Hyclate     REACTION: rash, swelling      Review of Systems  Constitutional: Negative for fever and chills.  HENT: Negative for ear pain and hearing loss.   Respiratory: Negative for cough and shortness of breath.   Cardiovascular: Negative for chest pain.  Neurological: Positive for dizziness. Negative for weakness and headaches.  Psychiatric/Behavioral: Negative for confusion.       Objective:   Physical Exam  Constitutional: He is oriented to person, place, and time. He appears well-developed and well-nourished.  HENT:  Right Ear: External ear normal.  Left Ear:  External ear normal.  Mouth/Throat: Oropharynx is clear and moist.  Neck: Neck supple. No thyromegaly present.  Cardiovascular: Normal rate and regular rhythm.   Pulmonary/Chest: Effort normal and breath sounds normal. No respiratory distress. He has no wheezes. He has no rales.  Neurological: He is alert and oriented to person, place, and time. No cranial nerve deficit. Coordination normal.  Psychiatric: He has a normal mood and affect. His behavior is normal. Judgment and thought content normal.          Assessment & Plan:  Transient vertigo. Suspect benign peripheral positional vertigo. Nonfocal exam. No worrisome red flags for vertigo. We've recommended observation. Consider vestibular rehabilitation if symptoms persist

## 2013-07-26 NOTE — Progress Notes (Signed)
Pre visit review using our clinic review tool, if applicable. No additional management support is needed unless otherwise documented below in the visit note. 

## 2013-08-17 ENCOUNTER — Other Ambulatory Visit (INDEPENDENT_AMBULATORY_CARE_PROVIDER_SITE_OTHER): Payer: BC Managed Care – PPO

## 2013-08-17 DIAGNOSIS — Z0000 Encounter for general adult medical examination without abnormal findings: Secondary | ICD-10-CM

## 2013-08-17 LAB — BASIC METABOLIC PANEL
BUN: 14 mg/dL (ref 6–23)
CO2: 27 mEq/L (ref 19–32)
Calcium: 9.9 mg/dL (ref 8.4–10.5)
Chloride: 105 mEq/L (ref 96–112)
Creatinine, Ser: 1 mg/dL (ref 0.4–1.5)
GFR: 79.95 mL/min (ref >60.00)
Glucose, Bld: 85 mg/dL (ref 70–99)
Potassium: 4.6 mEq/L (ref 3.5–5.1)
Sodium: 139 mEq/L (ref 135–145)

## 2013-08-17 LAB — HEPATIC FUNCTION PANEL
ALT: 46 U/L (ref 0–53)
AST: 32 U/L (ref 0–37)
Albumin: 3.7 g/dL (ref 3.5–5.2)
Alkaline Phosphatase: 65 U/L (ref 39–117)
Bilirubin, Direct: 0.2 mg/dL (ref 0.0–0.3)
Total Bilirubin: 0.7 mg/dL (ref 0.2–1.2)
Total Protein: 6.8 g/dL (ref 6.0–8.3)

## 2013-08-17 LAB — POCT URINALYSIS DIPSTICK
Bilirubin, UA: NEGATIVE
Glucose, UA: NEGATIVE
Ketones, UA: NEGATIVE
Leukocytes, UA: NEGATIVE
Nitrite, UA: NEGATIVE
Protein, UA: NEGATIVE
Spec Grav, UA: 1.025
Urobilinogen, UA: 0.2
pH, UA: 5.5

## 2013-08-17 LAB — CBC WITH DIFFERENTIAL/PLATELET
Basophils Absolute: 0 10*3/uL (ref 0.0–0.1)
Basophils Relative: 0.5 % (ref 0.0–3.0)
Eosinophils Absolute: 0.5 10*3/uL (ref 0.0–0.7)
Eosinophils Relative: 6.1 % — ABNORMAL HIGH (ref 0.0–5.0)
HCT: 45 % (ref 39.0–52.0)
Hemoglobin: 14.5 g/dL (ref 13.0–17.0)
Lymphocytes Relative: 32.9 % (ref 12.0–46.0)
Lymphs Abs: 2.6 10*3/uL (ref 0.7–4.0)
MCHC: 32.2 g/dL (ref 30.0–36.0)
MCV: 77.7 fl — ABNORMAL LOW (ref 78.0–100.0)
Monocytes Absolute: 0.6 10*3/uL (ref 0.1–1.0)
Monocytes Relative: 7.5 % (ref 3.0–12.0)
Neutro Abs: 4.1 10*3/uL (ref 1.4–7.7)
Neutrophils Relative %: 53 % (ref 43.0–77.0)
Platelets: 221 10*3/uL (ref 150.0–400.0)
RBC: 5.79 Mil/uL (ref 4.22–5.81)
RDW: 15.8 % — ABNORMAL HIGH (ref 11.5–15.5)
WBC: 7.8 10*3/uL (ref 4.0–10.5)

## 2013-08-17 LAB — LIPID PANEL
Cholesterol: 175 mg/dL (ref 0–200)
HDL: 35.9 mg/dL — ABNORMAL LOW (ref >39.00)
LDL Cholesterol: 111 mg/dL — ABNORMAL HIGH (ref 0–99)
Total CHOL/HDL Ratio: 5
Triglycerides: 140 mg/dL (ref 0.0–149.0)
VLDL: 28 mg/dL (ref 0.0–40.0)

## 2013-08-17 LAB — TSH: TSH: 3.48 u[IU]/mL (ref 0.35–4.50)

## 2013-08-17 LAB — PSA: PSA: 0.24 ng/mL (ref 0.10–4.00)

## 2013-08-19 ENCOUNTER — Ambulatory Visit (INDEPENDENT_AMBULATORY_CARE_PROVIDER_SITE_OTHER): Payer: BC Managed Care – PPO | Admitting: Family Medicine

## 2013-08-19 ENCOUNTER — Encounter: Payer: Self-pay | Admitting: Family Medicine

## 2013-08-19 VITALS — BP 132/76 | HR 72 | Temp 98.2°F | Ht 68.0 in | Wt 323.0 lb

## 2013-08-19 DIAGNOSIS — Z0000 Encounter for general adult medical examination without abnormal findings: Secondary | ICD-10-CM

## 2013-08-19 DIAGNOSIS — R319 Hematuria, unspecified: Secondary | ICD-10-CM

## 2013-08-19 LAB — POCT URINALYSIS DIPSTICK
Bilirubin, UA: NEGATIVE
Blood, UA: NEGATIVE
Glucose, UA: NEGATIVE
Ketones, UA: NEGATIVE
Leukocytes, UA: NEGATIVE
Nitrite, UA: NEGATIVE
Protein, UA: NEGATIVE
Spec Grav, UA: 1.02
Urobilinogen, UA: 0.2
pH, UA: 6

## 2013-08-19 NOTE — Progress Notes (Signed)
Pre visit review using our clinic review tool, if applicable. No additional management support is needed unless otherwise documented below in the visit note. 

## 2013-08-19 NOTE — Patient Instructions (Signed)
Consider app such as "[email or web address]" to help track calorie and energy expenditure.

## 2013-08-19 NOTE — Progress Notes (Signed)
   Subjective:    Patient ID: Antonio Watts, male    DOB: 06/13/1960, 53 y.o.   MRN: [id number]  HPI Patient seen for complete physical. History of hypertension treated with lisinopril. Had gradual weight gain over several years. Also past history of kidney stones. He has obstructive sleep apnea. He is planning to start exercise program soon. He does plan to make some dietary changes. Nonsmoker. Tetanus up-to-date. Colonoscopy up to date.  Past Medical History  Diagnosis Date  . OSA (obstructive sleep apnea)   . Kidney stones   . Kidney calculi   . Low testosterone   . Hypertension    No past surgical history on file.  reports that he quit smoking about 2 years ago. His smoking use included Cigarettes. He has a 35 pack-year smoking history. He has never used smokeless tobacco. He reports that he does not drink alcohol or use illicit drugs. family history includes Cancer in an other family member; Heart disease in an other family member; Heart failure in an other family member; Pulmonary fibrosis in his mother. Allergies  Allergen Reactions  . Doxycycline Hyclate     REACTION: rash, swelling      Review of Systems  Constitutional: Positive for fatigue. Negative for fever, activity change and appetite change.  HENT: Negative for congestion, ear pain and trouble swallowing.   Eyes: Negative for pain and visual disturbance.  Respiratory: Negative for cough, shortness of breath and wheezing.   Cardiovascular: Negative for chest pain and palpitations.  Gastrointestinal: Negative for nausea, vomiting, abdominal pain, diarrhea, constipation, blood in stool, abdominal distention and rectal pain.  Genitourinary: Negative for dysuria, hematuria and testicular pain.  Musculoskeletal: Negative for arthralgias and joint swelling.  Skin: Negative for rash.  Neurological: Negative for dizziness, syncope and headaches.  Hematological: Negative for adenopathy.  Psychiatric/Behavioral: Negative  for confusion and dysphoric mood.       Objective:   Physical Exam  Constitutional: He is oriented to person, place, and time. He appears well-developed and well-nourished. No distress.  HENT:  Head: Normocephalic and atraumatic.  Right Ear: External ear normal.  Left Ear: External ear normal.  Mouth/Throat: Oropharynx is clear and moist.  Eyes: Conjunctivae and EOM are normal. Pupils are equal, round, and reactive to light.  Neck: Normal range of motion. Neck supple. No thyromegaly present.  Cardiovascular: Normal rate, regular rhythm and normal heart sounds.   No murmur heard. Pulmonary/Chest: No respiratory distress. He has no wheezes. He has no rales.  Abdominal: Soft. Bowel sounds are normal. He exhibits no distension and no mass. There is no tenderness. There is no rebound and no guarding.  Musculoskeletal: He exhibits no edema.  Lymphadenopathy:    He has no cervical adenopathy.  Neurological: He is alert and oriented to person, place, and time. He displays normal reflexes. No cranial nerve deficit.  Skin: No rash noted.  Psychiatric: He has a normal mood and affect.          Assessment & Plan:  Complete physical. Labs reviewed. We spent quite some time discussing strategies for weight loss. He plans to start exercise program soon. We've made suggestions for reducing calories and also tracking calorie intake

## 2013-12-01 ENCOUNTER — Ambulatory Visit (INDEPENDENT_AMBULATORY_CARE_PROVIDER_SITE_OTHER): Payer: BC Managed Care – PPO | Admitting: Family Medicine

## 2013-12-01 ENCOUNTER — Encounter: Payer: Self-pay | Admitting: Family Medicine

## 2013-12-01 VITALS — BP 130/80 | HR 68 | Wt 327.0 lb

## 2013-12-01 DIAGNOSIS — M722 Plantar fascial fibromatosis: Secondary | ICD-10-CM

## 2013-12-01 NOTE — Progress Notes (Signed)
Pre visit review using our clinic review tool, if applicable. No additional management support is needed unless otherwise documented below in the visit note. 

## 2013-12-01 NOTE — Progress Notes (Signed)
   Subjective:    Patient ID: Antonio Watts, male    DOB: 02/11/1961, 53 y.o.   MRN: [id number]  Foot Pain Pertinent negatives include no weakness.   Left foot pain. No injury. Location is bottom of the heel. He works on his feet frequently. Pain is worse first thing in the morning after prolonged periods of sitting. Has not tried any icing. Takes meloxicam most days. Did recently get a new pair of boots.  Past Medical History  Diagnosis Date  . OSA (obstructive sleep apnea)   . Kidney stones   . Kidney calculi   . Low testosterone   . Hypertension    No past surgical history on file.  reports that he quit smoking about 2 years ago. His smoking use included Cigarettes. He has a 35 pack-year smoking history. He has never used smokeless tobacco. He reports that he does not drink alcohol or use illicit drugs. family history includes Cancer in an other family member; Heart disease in an other family member; Heart failure in an other family member; Pulmonary fibrosis in his mother. Allergies  Allergen Reactions  . Doxycycline Hyclate     REACTION: rash, swelling      Review of Systems  Neurological: Negative for weakness.       Objective:   Physical Exam  Constitutional: He appears well-developed and well-nourished.  Cardiovascular: Normal rate and regular rhythm.   Musculoskeletal:  Left foot reveals no edema. He has some tenderness along the attachment of the plantar fashion to the Calcaneus. No Achilles tenderness. No erythema. No warmth. No rashes.          Assessment & Plan:  Left plantar fasciitis. We've recommended stretching and icing. He'll consider some insole supports. Continue meloxicam.  We discussed pros and cons of steroid injection at this point he wishes to observe

## 2013-12-01 NOTE — Patient Instructions (Signed)
Plantar Fasciitis  Plantar fasciitis is a common condition that causes foot pain. It is soreness (inflammation) of the band of tough fibrous tissue on the bottom of the foot that runs from the heel bone (calcaneus) to the ball of the foot. The cause of this soreness may be from excessive standing, poor fitting shoes, running on hard surfaces, being overweight, having an abnormal walk, or overuse (this is common in runners) of the painful foot or feet. It is also common in aerobic exercise dancers and ballet dancers.  SYMPTOMS   Most people with plantar fasciitis complain of:   Severe pain in the morning on the bottom of their foot especially when taking the first steps out of bed. This pain recedes after a few minutes of walking.   Severe pain is experienced also during walking following a long period of inactivity.   Pain is worse when walking barefoot or up stairs  DIAGNOSIS    Your caregiver will diagnose this condition by examining and feeling your foot.   Special tests such as X-rays of your foot, are usually not needed.  PREVENTION    Consult a sports medicine professional before beginning a new exercise program.   Walking programs offer a good workout. With walking there is a lower chance of overuse injuries common to runners. There is less impact and less jarring of the joints.   Begin all new exercise programs slowly. If problems or pain develop, decrease the amount of time or distance until you are at a comfortable level.   Wear good shoes and replace them regularly.   Stretch your foot and the heel cords at the back of the ankle (Achilles tendon) both before and after exercise.   Run or exercise on even surfaces that are not hard. For example, asphalt is better than pavement.   Do not run barefoot on hard surfaces.   If using a treadmill, vary the incline.   Do not continue to workout if you have foot or joint problems. Seek professional help if they do not improve.  HOME CARE INSTRUCTIONS     Avoid activities that cause you pain until you recover.   Use ice or cold packs on the problem or painful areas after working out.   Only take over-the-counter or prescription medicines for pain, discomfort, or fever as directed by your caregiver.   Soft shoe inserts or athletic shoes with air or gel sole cushions may be helpful.   If problems continue or become more severe, consult a sports medicine caregiver or your own health care provider. Cortisone is a potent anti-inflammatory medication that may be injected into the painful area. You can discuss this treatment with your caregiver.  MAKE SURE YOU:    Understand these instructions.   Will watch your condition.   Will get help right away if you are not doing well or get worse.  Document Released: 12/04/2000 Document Revised: 06/03/2011 Document Reviewed: 02/03/2008  [employer] Patient Information 2015 [employer]. This information is not intended to replace advice given to you by your health care provider. Make sure you discuss any questions you have with your health care provider.

## 2014-04-23 ENCOUNTER — Other Ambulatory Visit: Payer: Self-pay | Admitting: Family Medicine

## 2014-08-10 ENCOUNTER — Encounter: Payer: Self-pay | Admitting: Family Medicine

## 2014-08-10 ENCOUNTER — Ambulatory Visit (INDEPENDENT_AMBULATORY_CARE_PROVIDER_SITE_OTHER): Payer: BLUE CROSS/BLUE SHIELD | Admitting: Family Medicine

## 2014-08-10 VITALS — BP 130/80 | HR 92 | Temp 98.2°F | Wt 317.0 lb

## 2014-08-10 DIAGNOSIS — R112 Nausea with vomiting, unspecified: Secondary | ICD-10-CM

## 2014-08-10 DIAGNOSIS — R197 Diarrhea, unspecified: Secondary | ICD-10-CM | POA: Diagnosis not present

## 2014-08-10 LAB — BASIC METABOLIC PANEL
BUN: 20 mg/dL (ref 6–23)
CO2: 22 mEq/L (ref 19–32)
Calcium: 10.6 mg/dL — ABNORMAL HIGH (ref 8.4–10.5)
Chloride: 103 mEq/L (ref 96–112)
Creatinine, Ser: 1.14 mg/dL (ref 0.40–1.50)
GFR: 70.85 mL/min (ref >60.00)
Glucose, Bld: 100 mg/dL — ABNORMAL HIGH (ref 70–99)
Potassium: 3.3 mEq/L — ABNORMAL LOW (ref 3.5–5.1)
Sodium: 134 mEq/L — ABNORMAL LOW (ref 135–145)

## 2014-08-10 NOTE — Progress Notes (Signed)
   Subjective:    Patient ID: Antonio Watts, male    DOB: 12/17/1960, 54 y.o.   MRN: [id number]  HPI  Acute visit. Onset Monday morning of vomiting and diarrhea and body aches. His vomiting and nausea had improved by yesterday but he still has some nonbloody diarrhea. Still has some fatigue issues. Poor appetite. Intermittent diffuse abdominal cramps. He states his urine still dark-colored but has not had any burning with urination. He is keeping down some Gatorade. No localizing abdominal pain. Feels very weak overall  Past Medical History  Diagnosis Date  . OSA (obstructive sleep apnea)   . Kidney stones   . Kidney calculi   . Low testosterone   . Hypertension    No past surgical history on file.  reports that he quit smoking about 3 years ago. His smoking use included Cigarettes. He has a 35 pack-year smoking history. He has never used smokeless tobacco. He reports that he does not drink alcohol or use illicit drugs. family history includes Cancer in an other family member; Heart disease in an other family member; Heart failure in an other family member; Pulmonary fibrosis in his mother. Allergies  Allergen Reactions  . Doxycycline Hyclate     REACTION: rash, swelling     Review of Systems  Constitutional: Positive for fatigue.  Cardiovascular: Negative for chest pain.  Gastrointestinal: Positive for abdominal pain and diarrhea.  Neurological: Positive for weakness.       Objective:   Physical Exam  Constitutional: He appears well-developed and well-nourished. No distress.  HENT:  Mouth/Throat: Oropharynx is clear and moist.  Cardiovascular: Normal rate and regular rhythm.   Pulmonary/Chest: Effort normal and breath sounds normal. No respiratory distress. He has no wheezes. He has no rales.  Abdominal: Soft. Bowel sounds are normal. He exhibits no distension. There is no tenderness. There is no rebound and no guarding.  Neurological: He is alert.            Assessment & Plan:  Diarrhea and vomiting. Suspect viral gastroenteritis. He has some persistent severe weakness. Suspect he is still dehydrated somewhat. Standing blood pressure 115/70. Check basic metabolic panel. Increase potassium rich foods. Work note written for Monday through tomorrow

## 2014-08-10 NOTE — Patient Instructions (Signed)
Potassium Content of Foods  Potassium is a mineral found in many foods and drinks. It helps keep fluids and minerals balanced in your body and affects how steadily your heart beats. Potassium also helps control your blood pressure and keep your muscles and nervous system healthy.  Certain health conditions and medicines may change the balance of potassium in your body. When this happens, you can help balance your level of potassium through the foods that you do or do not eat. Your health care provider or dietitian may recommend an amount of potassium that you should have each day. The following lists of foods provide the amount of potassium (in parentheses) per serving in each item.  HIGH IN POTASSIUM   The following foods and beverages have 200 mg or more of potassium per serving:  · Apricots, 2 raw or 5 dry (200 mg).  · Artichoke, 1 medium (345 mg).  · Avocado, raw,  ¼ each (245 mg).  · Banana, 1 medium (425 mg).  · Beans, lima, or baked beans, canned, ½ cup (280 mg).  · Beans, white, canned, ½ cup (595 mg).  · Beef roast, 3 oz (320 mg).  · Beef, ground, 3 oz (270 mg).  · Beets, raw or cooked, ½ cup (260 mg).  · Bran muffin, 2 oz (300 mg).  · Broccoli, ½ cup (230 mg).  · Brussels sprouts, ½ cup (250 mg).  · Cantaloupe, ½ cup (215 mg).  · Cereal, 100% bran, ½ cup (200-400 mg).  · Cheeseburger, single, fast food, 1 each (225-400 mg).  · Chicken, 3 oz (220 mg).  · Clams, canned, 3 oz (535 mg).  · Crab, 3 oz (225 mg).  · Dates, 5 each (270 mg).  · Dried beans and peas, ½ cup (300-475 mg).  · Figs, dried, 2 each (260 mg).  · Fish: halibut, tuna, cod, snapper, 3 oz (480 mg).  · Fish: salmon, haddock, swordfish, perch, 3 oz (300 mg).  · Fish, tuna, canned 3 oz (200 mg).  · French fries, fast food, 3 oz (470 mg).  · Granola with fruit and nuts, ½ cup (200 mg).  · Grapefruit juice, ½ cup (200 mg).  · Greens, beet, ½ cup (655 mg).  · Honeydew melon, ½ cup (200 mg).  · Kale, raw, 1 cup (300 mg).  · Kiwi, 1 medium (240  mg).  · Kohlrabi, rutabaga, parsnips, ½ cup (280 mg).  · Lentils, ½ cup (365 mg).  · Mango, 1 each (325 mg).  · Milk, chocolate, 1 cup (420 mg).  · Milk: nonfat, low-fat, whole, buttermilk, 1 cup (350-380 mg).  · Molasses, 1 Tbsp (295 mg).  · Mushrooms, ½ cup (280) mg.  · Nectarine, 1 each (275 mg).  · Nuts: almonds, peanuts, hazelnuts, [place], cashew, mixed, 1 oz (200 mg).  · Nuts, pistachios, 1 oz (295 mg).  · Orange, 1 each (240 mg).  · Orange juice, ½ cup (235 mg).  · Papaya, medium, ½ fruit (390 mg).  · Peanut butter, chunky, 2 Tbsp (240 mg).  · Peanut butter, smooth, 2 Tbsp (210 mg).  · Pear, 1 medium (200 mg).  · Pomegranate, 1 whole (400 mg).  · Pomegranate juice, ½ cup (215 mg).  · Pork, 3 oz (350 mg).  · Potato chips, salted, 1 oz (465 mg).  · Potato, baked with skin, 1 medium (925 mg).  · Potatoes, boiled, ½ cup (255 mg).  · Potatoes, mashed, ½ cup (330 mg).  · Prune juice, ½ cup (  370 mg).  · Prunes, 5 each (305 mg).  · Pudding, chocolate, ½ cup (230 mg).  · Pumpkin, canned, ½ cup (250 mg).  · Raisins, seedless, ¼ cup (270 mg).  · Seeds, sunflower or pumpkin, 1 oz (240 mg).  · Soy milk, 1 cup (300 mg).  · Spinach, ½ cup (420 mg).  · Spinach, canned, ½ cup (370 mg).  · Sweet potato, baked with skin, 1 medium (450 mg).  · Swiss chard, ½ cup (480 mg).  · Tomato or vegetable juice, ½ cup (275 mg).  · Tomato sauce or puree, ½ cup (400-550 mg).  · Tomato, raw, 1 medium (290 mg).  · Tomatoes, canned, ½ cup (200-300 mg).  · Turkey, 3 oz (250 mg).  · Wheat germ, 1 oz (250 mg).  · Winter squash, ½ cup (250 mg).  · Yogurt, plain or fruited, 6 oz (260-435 mg).  · Zucchini, ½ cup (220 mg).  MODERATE IN POTASSIUM  The following foods and beverages have 50-200 mg of potassium per serving:  · Apple, 1 each (150 mg).  · Apple juice, ½ cup (150 mg).  · Applesauce, ½ cup (90 mg).  · Apricot nectar, ½ cup (140 mg).  · Asparagus, small spears, ½ cup or 6 spears (155 mg).  · Bagel, cinnamon raisin, 1 each (130 mg).  · Bagel,  egg or plain, 4 in., 1 each (70 mg).  · Beans, green, ½ cup (90 mg).  · Beans, yellow, ½ cup (190 mg).  · Beer, regular, 12 oz (100 mg).  · Beets, canned, ½ cup (125 mg).  · Blackberries, ½ cup (115 mg).  · Blueberries, ½ cup (60 mg).  · Bread, whole wheat, 1 slice (70 mg).  · Broccoli, raw, ½ cup (145 mg).  · Cabbage, ½ cup (150 mg).  · Carrots, cooked or raw, ½ cup (180 mg).  · Cauliflower, raw, ½ cup (150 mg).  · Celery, raw, ½ cup (155 mg).  · Cereal, bran flakes, ½cup (120-150 mg).  · Cheese, cottage, ½ cup (110 mg).  · Cherries, 10 each (150 mg).  · Chocolate, 1½ oz bar (165 mg).  · Coffee, brewed 6 oz (90 mg).  · Corn, ½ cup or 1 ear (195 mg).  · Cucumbers, ½ cup (80 mg).  · Egg, large, 1 each (60 mg).  · Eggplant, ½ cup (60 mg).  · Endive, raw, ½cup (80 mg).  · English muffin, 1 each (65 mg).  · Fish, orange roughy, 3 oz (150 mg).  · Frankfurter, beef or pork, 1 each (75 mg).  · Fruit cocktail, ½ cup (115 mg).  · Grape juice, ½ cup (170 mg).  · Grapefruit, ½ fruit (175 mg).  · Grapes, ½ cup (155 mg).  · Greens: kale, turnip, collard, ½ cup (110-150 mg).  · Ice cream or frozen yogurt, chocolate, ½ cup (175 mg).  · Ice cream or frozen yogurt, vanilla, ½ cup (120-150 mg).  · Lemons, limes, 1 each (80 mg).  · Lettuce, all types, 1 cup (100 mg).  · Mixed vegetables, ½ cup (150 mg).  · Mushrooms, raw, ½ cup (110 mg).  · Nuts: walnuts, pecans, or macadamia, 1 oz (125 mg).  · Oatmeal, ½ cup (80 mg).  · Okra, ½ cup (110 mg).  · Onions, raw, ½ cup (120 mg).  · Peach, 1 each (185 mg).  · Peaches, canned, ½ cup (120 mg).  · Pears, canned, ½ cup (120 mg).  · Peas, green,   frozen, ½ cup (90 mg).  · Peppers, green, ½ cup (130 mg).  · Peppers, red, ½ cup (160 mg).  · Pineapple juice, ½ cup (165 mg).  · Pineapple, fresh or canned, ½ cup (100 mg).  · Plums, 1 each (105 mg).  · Pudding, vanilla, ½ cup (150 mg).  · Raspberries, ½ cup (90 mg).  · Rhubarb, ½ cup (115 mg).  · Rice, wild, ½ cup (80 mg).  · Shrimp, 3 oz (155  mg).  · Spinach, raw, 1 cup (170 mg).  · Strawberries, ½ cup (125 mg).  · Summer squash ½ cup (175-200 mg).  · Swiss chard, raw, 1 cup (135 mg).  · Tangerines, 1 each (140 mg).  · Tea, brewed, 6 oz (65 mg).  · Turnips, ½ cup (140 mg).  · Watermelon, ½ cup (85 mg).  · Wine, red, table, 5 oz (180 mg).  · Wine, white, table, 5 oz (100 mg).  LOW IN POTASSIUM  The following foods and beverages have less than 50 mg of potassium per serving.  · Bread, white, 1 slice (30 mg).  · Carbonated beverages, 12 oz (less than 5 mg).  · Cheese, 1 oz (20-30 mg).  · Cranberries, ½ cup (45 mg).  · Cranberry juice cocktail, ½ cup (20 mg).  · Fats and oils, 1 Tbsp (less than 5 mg).  · Hummus, 1 Tbsp (32 mg).  · Nectar: papaya, mango, or pear, ½ cup (35 mg).  · Rice, white or brown, ½ cup (50 mg).  · Spaghetti or macaroni, ½ cup cooked (30 mg).  · Tortilla, flour or corn, 1 each (50 mg).  · Waffle, 4 in., 1 each (50 mg).  · Water chestnuts, ½ cup (40 mg).  Document Released: 10/23/2004 Document Revised: 03/16/2013 Document Reviewed: 02/05/2013  [employer]® Patient Information ©2015 [employer]. This information is not intended to replace advice given to you by your health care provider. Make sure you discuss any questions you have with your health care provider.

## 2014-08-10 NOTE — Progress Notes (Signed)
Pre visit review using our clinic review tool, if applicable. No additional management support is needed unless otherwise documented below in the visit note. 

## 2014-08-15 ENCOUNTER — Ambulatory Visit (INDEPENDENT_AMBULATORY_CARE_PROVIDER_SITE_OTHER): Payer: BLUE CROSS/BLUE SHIELD | Admitting: Family Medicine

## 2014-08-15 ENCOUNTER — Encounter: Payer: Self-pay | Admitting: Family Medicine

## 2014-08-15 VITALS — BP 130/74 | HR 78 | Temp 98.4°F | Wt 327.0 lb

## 2014-08-15 DIAGNOSIS — R197 Diarrhea, unspecified: Secondary | ICD-10-CM

## 2014-08-15 NOTE — Patient Instructions (Signed)
Try some Imodium for the diarrhea.  Let me know if diarrhea not resolving over the next week.

## 2014-08-15 NOTE — Progress Notes (Signed)
Pre visit review using our clinic review tool, if applicable. No additional management support is needed unless otherwise documented below in the visit note. 

## 2014-08-15 NOTE — Progress Notes (Signed)
   Subjective:    Patient ID: Antonio Watts, male    DOB: 12/04/1960, 54 y.o.   MRN: [id number]  HPI Patient seen with recent GI illness. He had nausea, vomiting, and diarrhea. Nausea and vomiting have fully resolved. He is keeping down fluids and appetite has returned. However, he still about 4-5 loose stools each day. These usually occur early morning and then improve. Recent potassium 3.3. He denies any abdominal pain. No recent travels. No recent antibiotics. No sick exposures. No abdominal pain.  He has not tried any Imodium  Past Medical History  Diagnosis Date  . OSA (obstructive sleep apnea)   . Kidney stones   . Kidney calculi   . Low testosterone   . Hypertension    No past surgical history on file.  reports that he quit smoking about 3 years ago. His smoking use included Cigarettes. He has a 35 pack-year smoking history. He has never used smokeless tobacco. He reports that he does not drink alcohol or use illicit drugs. family history includes Cancer in an other family member; Heart disease in an other family member; Heart failure in an other family member; Pulmonary fibrosis in his mother. Allergies  Allergen Reactions  . Doxycycline Hyclate     REACTION: rash, swelling      Review of Systems  Constitutional: Negative for fever and chills.  Respiratory: Negative for shortness of breath.   Gastrointestinal: Positive for diarrhea. Negative for nausea, vomiting, abdominal pain and blood in stool.  Neurological: Negative for dizziness.       Objective:   Physical Exam  Constitutional: He appears well-developed and well-nourished.  HENT:  Mouth/Throat: Oropharynx is clear and moist.  Cardiovascular: Normal rate and regular rhythm.   Pulmonary/Chest: Effort normal and breath sounds normal. No respiratory distress. He has no wheezes. He has no rales.          Assessment & Plan:  Diarrhea. Suspect related recent viral illness. Overall, he is improved. Suggest try  over-the-counter Imodium. Touch base in one week if diarrhea not fully resolved

## 2014-09-12 ENCOUNTER — Other Ambulatory Visit (INDEPENDENT_AMBULATORY_CARE_PROVIDER_SITE_OTHER): Payer: BLUE CROSS/BLUE SHIELD

## 2014-09-12 DIAGNOSIS — Z0000 Encounter for general adult medical examination without abnormal findings: Secondary | ICD-10-CM | POA: Diagnosis not present

## 2014-09-12 LAB — BASIC METABOLIC PANEL
BUN: 13 mg/dL (ref 6–23)
CO2: 28 mEq/L (ref 19–32)
Calcium: 9.9 mg/dL (ref 8.4–10.5)
Chloride: 107 mEq/L (ref 96–112)
Creatinine, Ser: 0.96 mg/dL (ref 0.40–1.50)
GFR: 86.37 mL/min (ref >60.00)
Glucose, Bld: 93 mg/dL (ref 70–99)
Potassium: 4.6 mEq/L (ref 3.5–5.1)
Sodium: 140 mEq/L (ref 135–145)

## 2014-09-12 LAB — HEPATIC FUNCTION PANEL
ALT: 35 U/L (ref 0–53)
AST: 25 U/L (ref 0–37)
Albumin: 3.8 g/dL (ref 3.5–5.2)
Alkaline Phosphatase: 70 U/L (ref 39–117)
Bilirubin, Direct: 0.1 mg/dL (ref 0.0–0.3)
Total Bilirubin: 0.4 mg/dL (ref 0.2–1.2)
Total Protein: 6.3 g/dL (ref 6.0–8.3)

## 2014-09-12 LAB — CBC WITH DIFFERENTIAL/PLATELET
Basophils Absolute: 0 10*3/uL (ref 0.0–0.1)
Basophils Relative: 0.6 % (ref 0.0–3.0)
Eosinophils Absolute: 0.7 10*3/uL (ref 0.0–0.7)
Eosinophils Relative: 8.4 % — ABNORMAL HIGH (ref 0.0–5.0)
HCT: 42.3 % (ref 39.0–52.0)
Hemoglobin: 13.5 g/dL (ref 13.0–17.0)
Lymphocytes Relative: 38.4 % (ref 12.0–46.0)
Lymphs Abs: 3 10*3/uL (ref 0.7–4.0)
MCHC: 32 g/dL (ref 30.0–36.0)
MCV: 77.6 fl — ABNORMAL LOW (ref 78.0–100.0)
Monocytes Absolute: 0.6 10*3/uL (ref 0.1–1.0)
Monocytes Relative: 7.7 % (ref 3.0–12.0)
Neutro Abs: 3.5 10*3/uL (ref 1.4–7.7)
Neutrophils Relative %: 44.9 % (ref 43.0–77.0)
Platelets: 241 10*3/uL (ref 150.0–400.0)
RBC: 5.45 Mil/uL (ref 4.22–5.81)
RDW: 16.3 % — ABNORMAL HIGH (ref 11.5–15.5)
WBC: 7.8 10*3/uL (ref 4.0–10.5)

## 2014-09-12 LAB — TSH: TSH: 2 u[IU]/mL (ref 0.35–4.50)

## 2014-09-12 LAB — LIPID PANEL
Cholesterol: 142 mg/dL (ref 0–200)
HDL: 31.1 mg/dL — ABNORMAL LOW (ref >39.00)
LDL Cholesterol: 92 mg/dL (ref 0–99)
NonHDL: 110.9
Total CHOL/HDL Ratio: 5
Triglycerides: 97 mg/dL (ref 0.0–149.0)
VLDL: 19.4 mg/dL (ref 0.0–40.0)

## 2014-09-12 LAB — PSA: PSA: 0.29 ng/mL (ref 0.10–4.00)

## 2014-09-16 ENCOUNTER — Encounter: Payer: Self-pay | Admitting: Family Medicine

## 2014-09-16 ENCOUNTER — Ambulatory Visit (INDEPENDENT_AMBULATORY_CARE_PROVIDER_SITE_OTHER): Payer: BLUE CROSS/BLUE SHIELD | Admitting: Family Medicine

## 2014-09-16 VITALS — BP 130/78 | HR 70 | Temp 98.6°F | Ht 68.0 in | Wt 326.0 lb

## 2014-09-16 DIAGNOSIS — R0609 Other forms of dyspnea: Secondary | ICD-10-CM

## 2014-09-16 DIAGNOSIS — R0600 Dyspnea, unspecified: Secondary | ICD-10-CM

## 2014-09-16 DIAGNOSIS — Z0000 Encounter for general adult medical examination without abnormal findings: Secondary | ICD-10-CM | POA: Diagnosis not present

## 2014-09-16 NOTE — Progress Notes (Signed)
Subjective:    Patient ID: Antonio Watts, male    DOB: March 14, 1961, 54 y.o.   MRN: [id number]  HPI  Patient seen for complete physical. He has history of obesity, kidney stones, past history of smoking, obstructive sleep apnea on CPAP, low testosterone, hypertension. He's had progressive fatigue with exertion over the past year and also some progressive dyspnea with exertion. No history of known COPD. Quit smoking a few years ago. Has never had any exertional chest pains but is worried about possible coronary issues. His paternal grandfather had coronary disease. He has hypertension which has been controlled with lisinopril. No history of diabetes. History of low HDL.  Obstructive sleep apnea and uses CPAP consistently  Past Medical History  Diagnosis Date  . OSA (obstructive sleep apnea)   . Kidney stones   . Kidney calculi   . Low testosterone   . Hypertension    No past surgical history on file.  reports that he quit smoking about 3 years ago. His smoking use included Cigarettes. He has a 35 pack-year smoking history. He has never used smokeless tobacco. He reports that he does not drink alcohol or use illicit drugs. family history includes Cancer in an other family member; Heart disease in an other family member; Heart failure in an other family member; Pulmonary fibrosis in his mother. Allergies  Allergen Reactions  . Doxycycline Hyclate     REACTION: rash, swelling     Review of Systems  Constitutional: Negative for fever, activity change, appetite change, fatigue and unexpected weight change.  HENT: Negative for congestion, ear pain and trouble swallowing.   Eyes: Negative for pain and visual disturbance.  Respiratory: Positive for shortness of breath. Negative for cough and wheezing.   Cardiovascular: Negative for chest pain, palpitations and leg swelling.  Gastrointestinal: Negative for nausea, vomiting, abdominal pain, diarrhea, constipation, blood in stool, abdominal  distention and rectal pain.  Genitourinary: Negative for dysuria, hematuria and testicular pain.  Musculoskeletal: Negative for joint swelling and arthralgias.  Skin: Negative for rash.  Neurological: Negative for dizziness, syncope and headaches.  Hematological: Negative for adenopathy.  Psychiatric/Behavioral: Negative for confusion and dysphoric mood.       Objective:   Physical Exam  Constitutional: He is oriented to person, place, and time. He appears well-developed and well-nourished. No distress.  HENT:  Head: Normocephalic and atraumatic.  Right Ear: External ear normal.  Left Ear: External ear normal.  Mouth/Throat: Oropharynx is clear and moist.  Eyes: Conjunctivae and EOM are normal. Pupils are equal, round, and reactive to light.  Neck: Normal range of motion. Neck supple. No thyromegaly present.  Cardiovascular: Normal rate, regular rhythm and normal heart sounds.   No murmur heard. Pulmonary/Chest: No respiratory distress. He has no wheezes. He has no rales.  Abdominal: Soft. Bowel sounds are normal. He exhibits no distension and no mass. There is no tenderness. There is no rebound and no guarding.  Musculoskeletal: He exhibits no edema.  Lymphadenopathy:    He has no cervical adenopathy.  Neurological: He is alert and oriented to person, place, and time. He displays normal reflexes. No cranial nerve deficit.  Skin: No rash noted.  Psychiatric: He has a normal mood and affect.          Assessment & Plan:  Complete physical. Labs reviewed. He has low HDL. Other labs unremarkable. Colonoscopy up-to-date. Immunizations up-to-date. He complains of some progressive exertional dyspnea and fatigue over the past year. EKG sinus rhythm with no acute  findings. He is contemplating starting a more consistent exercise program. Check exercise tolerance test. discussed strategies for weight loss

## 2014-09-16 NOTE — Patient Instructions (Signed)
We will call you regarding exercise tolerance test.

## 2014-09-16 NOTE — Progress Notes (Signed)
Pre visit review using our clinic review tool, if applicable. No additional management support is needed unless otherwise documented below in the visit note. 

## 2014-09-19 ENCOUNTER — Encounter: Payer: Self-pay | Admitting: Gastroenterology

## 2014-10-06 ENCOUNTER — Telehealth (HOSPITAL_COMMUNITY): Payer: Self-pay

## 2014-10-06 NOTE — Telephone Encounter (Signed)
Encounter complete. 

## 2014-10-11 ENCOUNTER — Ambulatory Visit (HOSPITAL_COMMUNITY)
Admission: RE | Admit: 2014-10-11 | Discharge: 2014-10-11 | Disposition: A | Discharge status: Home / Self Care | Payer: BLUE CROSS/BLUE SHIELD | Source: Ambulatory Visit | Attending: Cardiovascular Disease | Admitting: Cardiovascular Disease

## 2014-10-11 DIAGNOSIS — R0609 Other forms of dyspnea: Secondary | ICD-10-CM | POA: Diagnosis not present

## 2014-10-11 DIAGNOSIS — R0600 Dyspnea, unspecified: Secondary | ICD-10-CM

## 2014-10-11 LAB — EXERCISE TOLERANCE TEST
Estimated workload: 7.3 METS
Exercise duration (min): 6 min
Exercise duration (sec): 12 s
MPHR: 165 {beats}/min
Peak HR: 150 {beats}/min
Percent HR: 90 %
RPE: 15
Rest HR: 72 {beats}/min

## 2014-12-29 ENCOUNTER — Encounter: Payer: Self-pay | Admitting: Family Medicine

## 2014-12-29 ENCOUNTER — Ambulatory Visit (INDEPENDENT_AMBULATORY_CARE_PROVIDER_SITE_OTHER): Payer: BLUE CROSS/BLUE SHIELD | Admitting: Family Medicine

## 2014-12-29 VITALS — BP 130/82 | HR 69 | Temp 99.0°F | Ht 69.0 in | Wt 323.7 lb

## 2014-12-29 DIAGNOSIS — J209 Acute bronchitis, unspecified: Secondary | ICD-10-CM | POA: Diagnosis not present

## 2014-12-29 MED ORDER — AZITHROMYCIN 250 MG PO TABS: ORAL_TABLET | ORAL | Status: AC

## 2014-12-29 NOTE — Patient Instructions (Signed)

## 2014-12-29 NOTE — Progress Notes (Signed)
Pre visit review using our clinic review tool, if applicable. No additional management support is needed unless otherwise documented below in the visit note. 

## 2014-12-29 NOTE — Progress Notes (Signed)
   Subjective:    Patient ID: Antonio Watts, male    DOB: Aug 16, 1960, 54 y.o.   MRN: [id number]  HPI Patient seen with acute illness. Onset last week of sinus congestion and cough. His symptoms are progressive suite. He's had chills intermittently but no documented fever. Has not taken his temperature. He has diffuse body aches. Does have some sore throat. Mild nausea but no vomiting. No sick contacts. Quit smoking couple years ago. Allergy to doxycycline  Past Medical History  Diagnosis Date  . OSA (obstructive sleep apnea)   . Kidney stones   . Kidney calculi   . Low testosterone   . Hypertension    No past surgical history on file.  reports that he quit smoking about 3 years ago. His smoking use included Cigarettes. He has a 35 pack-year smoking history. He has never used smokeless tobacco. He reports that he does not drink alcohol or use illicit drugs. family history includes Cancer in an other family member; Heart disease in an other family member; Heart failure in an other family member; Pulmonary fibrosis in his mother. Allergies  Allergen Reactions  . Doxycycline Hyclate     REACTION: rash, swelling      Review of Systems  Constitutional: Positive for fatigue. Negative for fever and chills.  HENT: Positive for congestion and sore throat.   Respiratory: Positive for cough.   Cardiovascular: Negative for chest pain.       Objective:   Physical Exam  Constitutional: He appears well-developed and well-nourished.  HENT:  Right Ear: External ear normal.  Left Ear: External ear normal.  Mouth/Throat: Oropharynx is clear and moist.  Neck: Neck supple.  Cardiovascular: Normal rate and regular rhythm.   Pulmonary/Chest: Effort normal and breath sounds normal. No respiratory distress. He has no wheezes. He has no rales.  Lymphadenopathy:    He has no cervical adenopathy.          Assessment & Plan:  Acute bronchitis. We explained these are usually viral. We've  recommended increased fluids and Mucinex. Start Zithromax if he has new worsening symptoms.

## 2015-04-26 ENCOUNTER — Other Ambulatory Visit: Payer: Self-pay | Admitting: Family Medicine

## 2015-07-17 ENCOUNTER — Emergency Department (HOSPITAL_COMMUNITY): Payer: BLUE CROSS/BLUE SHIELD

## 2015-07-17 ENCOUNTER — Encounter (HOSPITAL_COMMUNITY): Payer: Self-pay | Admitting: *Deleted

## 2015-07-17 ENCOUNTER — Emergency Department (HOSPITAL_COMMUNITY)
Admission: EM | Admit: 2015-07-17 | Discharge: 2015-07-17 | Disposition: A | Discharge status: Home / Self Care | Payer: BLUE CROSS/BLUE SHIELD | Attending: Emergency Medicine | Admitting: Emergency Medicine

## 2015-07-17 DIAGNOSIS — S32029A Unspecified fracture of second lumbar vertebra, initial encounter for closed fracture: Secondary | ICD-10-CM | POA: Diagnosis not present

## 2015-07-17 DIAGNOSIS — S32059A Unspecified fracture of fifth lumbar vertebra, initial encounter for closed fracture: Secondary | ICD-10-CM | POA: Diagnosis not present

## 2015-07-17 DIAGNOSIS — S32009A Unspecified fracture of unspecified lumbar vertebra, initial encounter for closed fracture: Secondary | ICD-10-CM | POA: Diagnosis not present

## 2015-07-17 DIAGNOSIS — Z791 Long term (current) use of non-steroidal anti-inflammatories (NSAID): Secondary | ICD-10-CM | POA: Diagnosis not present

## 2015-07-17 DIAGNOSIS — Y999 Unspecified external cause status: Secondary | ICD-10-CM | POA: Diagnosis not present

## 2015-07-17 DIAGNOSIS — M545 Low back pain: Secondary | ICD-10-CM | POA: Diagnosis not present

## 2015-07-17 DIAGNOSIS — Y9301 Activity, walking, marching and hiking: Secondary | ICD-10-CM | POA: Diagnosis not present

## 2015-07-17 DIAGNOSIS — Z87891 Personal history of nicotine dependence: Secondary | ICD-10-CM | POA: Diagnosis not present

## 2015-07-17 DIAGNOSIS — T148 Other injury of unspecified body region: Secondary | ICD-10-CM | POA: Diagnosis not present

## 2015-07-17 DIAGNOSIS — Y92009 Unspecified place in unspecified non-institutional (private) residence as the place of occurrence of the external cause: Secondary | ICD-10-CM | POA: Diagnosis not present

## 2015-07-17 DIAGNOSIS — M549 Dorsalgia, unspecified: Secondary | ICD-10-CM | POA: Diagnosis not present

## 2015-07-17 DIAGNOSIS — I10 Essential (primary) hypertension: Secondary | ICD-10-CM | POA: Insufficient documentation

## 2015-07-17 DIAGNOSIS — S32019A Unspecified fracture of first lumbar vertebra, initial encounter for closed fracture: Secondary | ICD-10-CM | POA: Diagnosis not present

## 2015-07-17 DIAGNOSIS — W108XXA Fall (on) (from) other stairs and steps, initial encounter: Secondary | ICD-10-CM | POA: Insufficient documentation

## 2015-07-17 DIAGNOSIS — S32039A Unspecified fracture of third lumbar vertebra, initial encounter for closed fracture: Secondary | ICD-10-CM | POA: Diagnosis not present

## 2015-07-17 DIAGNOSIS — Z79899 Other long term (current) drug therapy: Secondary | ICD-10-CM | POA: Diagnosis not present

## 2015-07-17 DIAGNOSIS — W19XXXA Unspecified fall, initial encounter: Secondary | ICD-10-CM

## 2015-07-17 DIAGNOSIS — G4733 Obstructive sleep apnea (adult) (pediatric): Secondary | ICD-10-CM | POA: Insufficient documentation

## 2015-07-17 DIAGNOSIS — S3992XA Unspecified injury of lower back, initial encounter: Secondary | ICD-10-CM | POA: Diagnosis not present

## 2015-07-17 DIAGNOSIS — S299XXA Unspecified injury of thorax, initial encounter: Secondary | ICD-10-CM | POA: Diagnosis not present

## 2015-07-17 MED ORDER — OXYCODONE-ACETAMINOPHEN 5-325 MG PO TABS
1.0000 | ORAL_TABLET | Freq: Once | ORAL | Status: AC
Start: 2015-07-17 — End: 2015-07-17
  Administered 2015-07-17: 1 via ORAL
  Filled 2015-07-17: qty 1

## 2015-07-17 MED ORDER — CYCLOBENZAPRINE HCL 10 MG PO TABS: 10.0000 mg | ORAL_TABLET | Freq: Two times a day (BID) | ORAL | Status: DC | PRN

## 2015-07-17 MED ORDER — MORPHINE SULFATE 30 MG PO TABS: 30.0000 mg | ORAL_TABLET | ORAL | Status: DC | PRN

## 2015-07-17 NOTE — ED Notes (Signed)
Bed: [facility]D Expected date:  Expected time:  Means of arrival:  Comments: No Bed 

## 2015-07-17 NOTE — ED Provider Notes (Signed)
CSN: [id number]     Arrival date & time 07/17/15  1701 History   First MD Initiated Contact with Patient 07/17/15 1723     Chief Complaint  Patient presents with  . Fall  . Back Pain     (Consider location/radiation/quality/duration/timing/severity/associated sxs/prior Treatment) HPI   Patient is a 55 year old male with past medical history of OSA and hypertension who presents the ED via EMS status post fall. Patient reports he was walking down his stairs at home when he slipped on the 2nd to last step resulting in his lower back landing on the stairs. Denies head injury or LOC. Patient endorses having an 8/10 constant achy pain to his lower back which she notes is worse on the left side. States pain is worse with movement. Pt denies fever, numbness, tingling, saddle anesthesia, loss of bowel or bladder, urinary retention, abdominal pain, N/V/D, weakness. Patient reports he was given Fentanyl en route and endorses mild relief of pain.  Past Medical History  Diagnosis Date  . OSA (obstructive sleep apnea)   . Kidney stones   . Kidney calculi   . Low testosterone   . Hypertension    History reviewed. No pertinent past surgical history. Family History  Problem Relation Age of Onset  . Cancer      fhx  . Heart disease      fhx  . Heart failure    . Pulmonary fibrosis Mother    Social History  Substance Use Topics  . Smoking status: Former Smoker -- 1.00 packs/day for 35 years    Types: Cigarettes    Quit date: 05/26/2011  . Smokeless tobacco: Never Used  . Alcohol Use: No    Review of Systems  Musculoskeletal: Positive for back pain.  All other systems reviewed and are negative.     Allergies  Doxycycline hyclate  Home Medications   Prior to Admission medications   Medication Sig Start Date End Date Taking? Authorizing Provider  b complex vitamins tablet Take 1 tablet by mouth daily.   Yes Historical Provider, MD  Doxylamine Succinate, Sleep, (SLEEP AID PO)  Take 2 tablets by mouth at bedtime as needed (sleep).   Yes Historical Provider, MD  ibuprofen (ADVIL,MOTRIN) 200 MG tablet Take 600 mg by mouth daily as needed for moderate pain.   Yes Historical Provider, MD  lisinopril (PRINIVIL,ZESTRIL) 10 MG tablet TAKE 1 TABLET BY MOUTH EVERY DAY 04/26/15  Yes Kristian Covey, MD  loratadine (CLARITIN) 10 MG tablet Take 10 mg by mouth daily.   Yes Historical Provider, MD  Multiple Vitamin (MULTIVITAMIN WITH MINERALS) TABS tablet Take 1 tablet by mouth daily.   Yes Historical Provider, MD  Omega-3 Fatty Acids (FISH OIL PO) Take 1 tablet by mouth daily.   Yes Historical Provider, MD  Probiotic CAPS Take 1 capsule by mouth daily.   Yes Historical Provider, MD  Pyridoxine HCl (B-6 PO) Take 1 tablet by mouth daily.   Yes Historical Provider, MD  cyclobenzaprine (FLEXERIL) 10 MG tablet Take 1 tablet (10 mg total) by mouth 2 (two) times daily as needed for muscle spasms. 07/17/15   Barrett Henle, PA-C  morphine (MSIR) 30 MG tablet Take 1 tablet (30 mg total) by mouth every 4 (four) hours as needed for severe pain. 07/17/15   Barrett Henle, PA-C  NON FORMULARY CPAP at bedtime     Historical Provider, MD   BP 116/69 mmHg  Pulse 65  Temp(Src) 98.5 F (36.9 C) (Oral)  Resp 18  SpO2 96% Physical Exam  Constitutional: He is oriented to person, place, and time. He appears well-developed and well-nourished.  HENT:  Head: Normocephalic and atraumatic. Head is without raccoon's eyes, without Battle's sign, without abrasion, without contusion and without laceration.  Mouth/Throat: Uvula is midline, oropharynx is clear and moist and mucous membranes are normal. No oropharyngeal exudate.  Eyes: Conjunctivae and EOM are normal. Pupils are equal, round, and reactive to light. Right eye exhibits no discharge. Left eye exhibits no discharge. No scleral icterus.  Neck: Normal range of motion. Neck supple.  Cardiovascular: Normal rate, regular rhythm, normal  heart sounds and intact distal pulses.   Pulmonary/Chest: Effort normal and breath sounds normal. No respiratory distress. He has no wheezes. He has no rales. He exhibits no tenderness.  Abdominal: Soft. Bowel sounds are normal. He exhibits no distension and no mass. There is no tenderness. There is no rebound and no guarding.  Musculoskeletal: He exhibits no edema.  TTP over lower midline thoracic and lumbar spine. No cervical midline tenderness. Abrasion noted to midline and left lumbar region. Dec ROM of back due to pain, FROM of neck. Dec ROM of BLE due to reported pain, FROM of bilateral knees, ankles and feet. 5/5 strength. Sensation intact. 2+ DP pulses. Cap refill <2.   Lymphadenopathy:    He has no cervical adenopathy.  Neurological: He is alert and oriented to person, place, and time.  Skin: Skin is warm and dry.  Nursing note and vitals reviewed.   ED Course  Procedures (including critical care time) Labs Review Labs Reviewed - No data to display  Imaging Review Dg Thoracic Spine W/swimmers  07/17/2015  CLINICAL DATA:  Fall EXAM: THORACIC SPINE - 3 VIEWS COMPARISON:  02/16/2010 FINDINGS: No acute fracture. No dislocation. Mild degenerative change in the mid thoracic spine. IMPRESSION: No acute bony pathology. Electronically Signed   By: Jolaine ClickArthur  Hoss M.D.   On: 07/17/2015 18:39   Dg Lumbar Spine Complete  07/17/2015  CLINICAL DATA:  Low back pain after falling down steps today. Initial encounter. EXAM: LUMBAR SPINE - COMPLETE 4+ VIEW COMPARISON:  CT 02/15/2008. FINDINGS: There are 5 lumbar type vertebral bodies. The alignment is normal. There is no evidence of vertebral body fracture or traumatic subluxation. There is suspicion of a left L1 transverse process fracture. The left L2 transverse process may be fractured as well. The disc spaces are preserved. There is mild intervertebral spurring. IMPRESSION: Suspected left transverse process fractures at L1 and L2. No evidence of  vertebral body fracture or subluxation. Electronically Signed   By: Carey BullocksWilliam  Veazey M.D.   On: 07/17/2015 18:51   Ct Lumbar Spine Wo Contrast  07/17/2015  CLINICAL DATA:  Status post slip and fall onto lower back, down 5-6 steps, with lower back pain, worse on the left. Initial encounter. EXAM: CT LUMBAR SPINE WITHOUT CONTRAST TECHNIQUE: Multidetector CT imaging of the lumbar spine was performed without intravenous contrast administration. Multiplanar CT image reconstructions were also generated. COMPARISON:  Lumbar spine radiograph performed earlier today at 6:23 p.m., and CT of the abdomen and pelvis performed 02/15/2008 FINDINGS: There are mildly displaced fractures of the left transverse processes of L1, L2 and L3. No additional fractures are seen. Vertebral bodies demonstrate normal height and alignment. Intervertebral disc spaces are preserved. The bony foramina are grossly unremarkable in appearance. No significant disc protrusion is identified. Visualized soft tissues are grossly unremarkable. The paraspinal musculature is maintained. IMPRESSION: Mildly displaced fractures of the left transverse  processes of L1, L2 and L3. Electronically Signed   By: Roanna Raider M.D.   On: 07/17/2015 20:39   I have personally reviewed and evaluated these images and lab results as part of my medical decision-making.   EKG Interpretation None      MDM   Final diagnoses:  Fall  Lumbar transverse process fracture, closed, initial encounter [facility])    Patient presents with back pain status post fall that occurred prior to arrival. Denies head injury or LOC. VSS. Exam revealed midline lower thoracic and lumbar spine tenderness to palpation, decreased range of motion of back due to reported pain, bilateral lower extremities otherwise neurovascularly intact. No other signs of injury/trauma. No back pain red flags or evidence of spinal cord compression. Patient given pain meds in the ED. Thoracic spine x-ray  negative. Lumbar spine x-ray revealed suspected left transverse process fractures at L1 and L2. CT lumbar spine ordered for further evaluation of suspected fractures. CT lumbar spine revealed mildly displaced fractures of left transverse processes of L1, L2 and L3. Discussed results and plan for discharge with patient. Patient discharged home with pain meds, muscle relaxant and discussed symptomatic treatment. Patient given contact information to follow-up with orthopedics.    Satira Sark [place], [place] 07/18/15 1835  Lyndal Pulley, MD 07/21/15 [phone]

## 2015-07-17 NOTE — ED Notes (Signed)
Per EMS, pt slipped at home on his steps, falling on his back down 5=6 steps. Pt complains of pain to lower back area. Pt states pain is worse on left side. Pt received fentanyl, 500cc NS en route to hospital.

## 2015-07-17 NOTE — Discharge Instructions (Signed)
Take your medications as prescribed. I also recommend applying ice to affected area for 15-20 minutes 3-4 times daily to help with pain. Refrain from doing any heavy lifting, strenuous activity or exacerbating symptoms until you have followed up with your primary care provider or orthopedist. Follow up with your primary care provider in 4-5 days. I also recommend following up with the orthopedist office listed above within the next week. Please return to the Emergency Department if symptoms worsen or new onset of fever, numbness, tingling, groin anesthesia, loss of bowel or bladder, weakness.

## 2015-07-21 DIAGNOSIS — S32008A Other fracture of unspecified lumbar vertebra, initial encounter for closed fracture: Secondary | ICD-10-CM | POA: Diagnosis not present

## 2015-07-21 DIAGNOSIS — M546 Pain in thoracic spine: Secondary | ICD-10-CM | POA: Diagnosis not present

## 2015-07-25 ENCOUNTER — Other Ambulatory Visit: Payer: BLUE CROSS/BLUE SHIELD

## 2015-07-25 ENCOUNTER — Other Ambulatory Visit: Payer: Self-pay | Admitting: Orthopaedic Surgery

## 2015-07-25 DIAGNOSIS — S22009A Unspecified fracture of unspecified thoracic vertebra, initial encounter for closed fracture: Secondary | ICD-10-CM

## 2015-07-25 DIAGNOSIS — Z77018 Contact with and (suspected) exposure to other hazardous metals: Secondary | ICD-10-CM

## 2015-07-25 DIAGNOSIS — S32010A Wedge compression fracture of first lumbar vertebra, initial encounter for closed fracture: Secondary | ICD-10-CM

## 2015-07-27 DIAGNOSIS — G4733 Obstructive sleep apnea (adult) (pediatric): Secondary | ICD-10-CM | POA: Diagnosis not present

## 2015-08-28 DIAGNOSIS — G4733 Obstructive sleep apnea (adult) (pediatric): Secondary | ICD-10-CM | POA: Diagnosis not present

## 2015-10-23 ENCOUNTER — Other Ambulatory Visit: Payer: Self-pay | Admitting: Emergency Medicine

## 2015-10-23 MED ORDER — LISINOPRIL 10 MG PO TABS: 10.0000 mg | ORAL_TABLET | Freq: Every day | ORAL | 0 refills | Status: DC

## 2016-01-18 ENCOUNTER — Other Ambulatory Visit: Payer: Self-pay | Admitting: Family Medicine

## 2016-03-11 ENCOUNTER — Encounter: Payer: Self-pay | Admitting: Family Medicine

## 2016-03-11 ENCOUNTER — Ambulatory Visit (INDEPENDENT_AMBULATORY_CARE_PROVIDER_SITE_OTHER): Payer: BLUE CROSS/BLUE SHIELD | Admitting: Family Medicine

## 2016-03-11 VITALS — BP 128/80 | HR 73 | Temp 98.1°F | Ht 69.0 in | Wt 336.0 lb

## 2016-03-11 DIAGNOSIS — M5416 Radiculopathy, lumbar region: Secondary | ICD-10-CM | POA: Diagnosis not present

## 2016-03-11 MED ORDER — HYDROCODONE-ACETAMINOPHEN 5-325 MG PO TABS: 1.0000 | ORAL_TABLET | Freq: Four times a day (QID) | ORAL | 0 refills | Status: DC | PRN

## 2016-03-11 MED ORDER — PREDNISONE 10 MG PO TABS
ORAL_TABLET | ORAL | 0 refills | Status: DC
Start: 2016-03-11 — End: 2016-04-26

## 2016-03-11 NOTE — Patient Instructions (Signed)

## 2016-03-11 NOTE — Progress Notes (Signed)
Pre visit review using our clinic review tool, if applicable. No additional management support is needed unless otherwise documented below in the visit note. 

## 2016-03-11 NOTE — Progress Notes (Signed)
Subjective:     Patient ID: Antonio Watts, male   DOB: 10/20/1960, 55 y.o.   MRN: [id number]  HPI Patient seen with right lower lumbar back pain. Onset 3 days ago. He woke up with pain. Denies any recent injury. He did have significant fall last April where he slipped going down some stairs and landed directly on the stairs and had reportedly transverse process fractures of L1-L3. These seem to be healed from that completely. CT scan of that time revealed no evidence for any disc herniation.  Pain is sharp and radiates from his lower right lumbar area down toward the thigh around the knee region. No numbness. No weakness. No stool or urine incontinence. Has used ibuprofen without much improvement. His pain is worse with position changing position and also lifting up the right lower extremity or extending the leg. Current pain is severe at times and interfering with sleep.  Past Medical History:  Diagnosis Date  . Hypertension   . Kidney calculi   . Kidney stones   . Low testosterone   . OSA (obstructive sleep apnea)    History reviewed. No pertinent surgical history.  reports that he quit smoking about 4 years ago. His smoking use included Cigarettes. He has a 35.00 pack-year smoking history. He has never used smokeless tobacco. He reports that he does not drink alcohol or use drugs. family history includes Pulmonary fibrosis in his mother. Allergies  Allergen Reactions  . Doxycycline Hyclate     REACTION: rash, swelling     Review of Systems  Constitutional: Negative for activity change, appetite change and fever.  Respiratory: Negative for cough and shortness of breath.   Cardiovascular: Negative for chest pain and leg swelling.  Gastrointestinal: Negative for abdominal pain and vomiting.  Genitourinary: Negative for dysuria, flank pain and hematuria.  Musculoskeletal: Positive for back pain. Negative for joint swelling.  Neurological: Negative for weakness and numbness.        Objective:   Physical Exam  Constitutional: He is oriented to person, place, and time. He appears well-developed and well-nourished. No distress.  Neck: No thyromegaly present.  Cardiovascular: Normal rate, regular rhythm and normal heart sounds.   No murmur heard. Pulmonary/Chest: Effort normal and breath sounds normal. No respiratory distress. He has no wheezes. He has no rales.  Musculoskeletal: He exhibits no edema.  Positive straight leg raise on the right  Neurological: He is alert and oriented to person, place, and time. He has normal reflexes. No cranial nerve deficit.  Full-strength lower extremities with symmetric reflexes  Skin: No rash noted.       Assessment:     Right lumbar back pain with radiculitis symptoms-but nonfocal neurologic exam at this time    Plan:     -Prednisone taper over the next 8 days -Avoid concomitant use of ibuprofen with prednisone -Limited Vicodin 5/325 mg one every 6 hours as needed for severe pain #30 with no refill. Is cautioned not to drive when taking this -Reviewed proper lifting -Follow-up in 1-2 weeks if not improving  Kristian CoveyBruce W Burchette MD [facility]

## 2016-03-26 DIAGNOSIS — G4733 Obstructive sleep apnea (adult) (pediatric): Secondary | ICD-10-CM | POA: Diagnosis not present

## 2016-04-15 ENCOUNTER — Other Ambulatory Visit: Payer: Self-pay | Admitting: Family Medicine

## 2016-04-26 ENCOUNTER — Encounter: Payer: Self-pay | Admitting: Family Medicine

## 2016-04-26 ENCOUNTER — Ambulatory Visit (INDEPENDENT_AMBULATORY_CARE_PROVIDER_SITE_OTHER): Payer: BLUE CROSS/BLUE SHIELD | Admitting: Family Medicine

## 2016-04-26 VITALS — BP 100/70 | HR 84 | Temp 98.4°F | Ht 69.0 in | Wt 336.0 lb

## 2016-04-26 DIAGNOSIS — R059 Cough, unspecified: Secondary | ICD-10-CM

## 2016-04-26 DIAGNOSIS — B9789 Other viral agents as the cause of diseases classified elsewhere: Secondary | ICD-10-CM

## 2016-04-26 DIAGNOSIS — R05 Cough: Secondary | ICD-10-CM

## 2016-04-26 DIAGNOSIS — J069 Acute upper respiratory infection, unspecified: Secondary | ICD-10-CM

## 2016-04-26 LAB — POCT INFLUENZA A/B
Influenza A, POC: NEGATIVE
Influenza B, POC: NEGATIVE

## 2016-04-26 NOTE — Patient Instructions (Signed)
Viral Illness, Adult Viruses are tiny germs that can get into a person's body and cause illness. There are many different types of viruses, and they cause many types of illness. Viral illnesses can range from mild to severe. They can affect various parts of the body. Common illnesses that are caused by a virus include colds and the flu. Viral illnesses also include serious conditions such as HIV/AIDS (human immunodeficiency virus/acquired immunodeficiency syndrome). A few viruses have been linked to certain cancers. What are the causes? Many types of viruses can cause illness. Viruses invade cells in your body, multiply, and cause the infected cells to malfunction or die. When the cell dies, it releases more of the virus. When this happens, you develop symptoms of the illness, and the virus continues to spread to other cells. If the virus takes over the function of the cell, it can cause the cell to divide and grow out of control, as is the case when a virus causes cancer. Different viruses get into the body in different ways. You can get a virus by:  Swallowing food or water that is contaminated with the virus.  Breathing in droplets that have been coughed or sneezed into the air by an infected person.  Touching a surface that has been contaminated with the virus and then touching your eyes, nose, or mouth.  Being bitten by an insect or animal that carries the virus.  Having sexual contact with a person who is infected with the virus.  Being exposed to blood or fluids that contain the virus, either through an open cut or during a transfusion. If a virus enters your body, your body's defense system (immune system) will try to fight the virus. You may be at higher risk for a viral illness if your immune system is weak. What are the signs or symptoms? Symptoms vary depending on the type of virus and the location of the cells that it invades. Common symptoms of the main types of viral illnesses  include: Cold and flu viruses   Fever.  Headache.  Sore throat.  Muscle aches.  Nasal congestion.  Cough. Digestive system (gastrointestinal) viruses   Fever.  Abdominal pain.  Nausea.  Diarrhea. Liver viruses (hepatitis)   Loss of appetite.  Tiredness.  Yellowing of the skin (jaundice). Brain and spinal cord viruses   Fever.  Headache.  Stiff neck.  Nausea and vomiting.  Confusion or sleepiness. Skin viruses   Warts.  Itching.  Rash. Sexually transmitted viruses   Discharge.  Swelling.  Redness.  Rash. How is this treated? Viruses can be difficult to treat because they live within cells. Antibiotic medicines do not treat viruses because these drugs do not get inside cells. Treatment for a viral illness may include:  Resting and drinking plenty of fluids.  Medicines to relieve symptoms. These can include over-the-counter medicine for pain and fever, medicines for cough or congestion, and medicines to relieve diarrhea.  Antiviral medicines. These drugs are available only for certain types of viruses. They may help reduce flu symptoms if taken early. There are also many antiviral medicines for hepatitis and HIV/AIDS. Some viral illnesses can be prevented with vaccinations. A common example is the flu shot. Follow these instructions at home: Medicines    Take over-the-counter and prescription medicines only as told by your health care provider.  If you were prescribed an antiviral medicine, take it as told by your health care provider. Do not stop taking the medicine even if you start to   feel better.  Be aware of when antibiotics are needed and when they are not needed. Antibiotics do not treat viruses. If your health care provider thinks that you may have a bacterial infection as well as a viral infection, you may get an antibiotic.  Do not ask for an antibiotic prescription if you have been diagnosed with a viral illness. That will not make  your illness go away faster.  Frequently taking antibiotics when they are not needed can lead to antibiotic resistance. When this develops, the medicine no longer works against the bacteria that it normally fights. General instructions   Drink enough fluids to keep your urine clear or pale yellow.  Rest as much as possible.  Return to your normal activities as told by your health care provider. Ask your health care provider what activities are safe for you.  Keep all follow-up visits as told by your health care provider. This is important. How is this prevented? Take these actions to reduce your risk of viral infection:  Eat a healthy diet and get enough rest.  Wash your hands often with soap and water. This is especially important when you are in public places. If soap and water are not available, use hand sanitizer.  Avoid close contact with friends and family who have a viral illness.  If you travel to areas where viral gastrointestinal infection is common, avoid drinking water or eating raw food.  Keep your immunizations up to date. Get a flu shot every year as told by your health care provider.  Do not share toothbrushes, nail clippers, razors, or needles with other people.  Always practice safe sex. Contact a health care provider if:  You have symptoms of a viral illness that do not go away.  Your symptoms come back after going away.  Your symptoms get worse. Get help right away if:  You have trouble breathing.  You have a severe headache or a stiff neck.  You have severe vomiting or abdominal pain. This information is not intended to replace advice given to you by your health care provider. Make sure you discuss any questions you have with your health care provider. Document Released: 07/21/2015 Document Revised: 08/23/2015 Document Reviewed: 07/21/2015 Elsevier Interactive Patient Education  2017 [employer]  

## 2016-04-26 NOTE — Progress Notes (Signed)
Pre visit review using our clinic review tool, if applicable. No additional management support is needed unless otherwise documented below in the visit note. 

## 2016-04-26 NOTE — Progress Notes (Signed)
Subjective:     Patient ID: Antonio Watts, male   DOB: 03/28/1960, 56 y.o.   MRN: [id number]  HPI Patient seen with onset Wednesday night of upper respiratory symptoms of cough, nasal congestion and body aches. He also had some chills and subjective fever. He does not think he has had any fever today. Temperature not taken. Cough productive of thick sputum. Took some over-the-counter DayQuil and NyQuil with some relief. He has obstructive sleep apnea and uses CPAP regularly. Nonsmoker. Actually quit back in 2013. He's had increased fatigue. No dyspnea. No sick contacts.  Past Medical History:  Diagnosis Date  . Hypertension   . Kidney calculi   . Kidney stones   . Low testosterone   . OSA (obstructive sleep apnea)    No past surgical history on file.  reports that he quit smoking about 4 years ago. His smoking use included Cigarettes. He has a 35.00 pack-year smoking history. He has never used smokeless tobacco. He reports that he does not drink alcohol or use drugs. family history includes Pulmonary fibrosis in his mother. Allergies  Allergen Reactions  . Doxycycline Hyclate     REACTION: rash, swelling     Review of Systems  Constitutional: Positive for chills and fatigue. Negative for activity change, appetite change, fever and unexpected weight change.  HENT: Positive for congestion. Negative for ear pain, sore throat and trouble swallowing.   Respiratory: Positive for cough. Negative for shortness of breath, wheezing and stridor.   Cardiovascular: Negative for chest pain and leg swelling.  Gastrointestinal: Negative for abdominal pain.  Musculoskeletal: Negative for arthralgias.  Skin: Negative for rash.  Neurological: Negative for syncope and headaches.  Hematological: Negative for adenopathy.       Objective:   Physical Exam  Constitutional: He appears well-developed and well-nourished.  HENT:  Right Ear: External ear normal.  Left Ear: External ear normal.   Mouth/Throat: Oropharynx is clear and moist.  Neck: Neck supple.  Cardiovascular: Normal rate and regular rhythm.   Pulmonary/Chest: Effort normal and breath sounds normal. No respiratory distress. He has no wheezes. He has no rales.  Musculoskeletal: He exhibits no edema.  Lymphadenopathy:    He has no cervical adenopathy.       Assessment:     Probable viral URI with cough. Rapid flu screen negative    Plan:     -Recommend good hydration -Continue over-the-counter cough medications as needed -Follow-up promptly for any recurrent fever or worsening symptoms  Kristian CoveyBruce W Mekhi Sonn MD [facility]

## 2016-06-17 DIAGNOSIS — G4733 Obstructive sleep apnea (adult) (pediatric): Secondary | ICD-10-CM | POA: Diagnosis not present

## 2016-08-06 ENCOUNTER — Encounter: Payer: Self-pay | Admitting: Family Medicine

## 2016-08-06 ENCOUNTER — Ambulatory Visit (INDEPENDENT_AMBULATORY_CARE_PROVIDER_SITE_OTHER): Payer: BLUE CROSS/BLUE SHIELD | Admitting: Family Medicine

## 2016-08-06 VITALS — BP 132/88 | HR 68 | Temp 98.4°F | Ht 69.0 in | Wt 342.6 lb

## 2016-08-06 DIAGNOSIS — G4733 Obstructive sleep apnea (adult) (pediatric): Secondary | ICD-10-CM

## 2016-08-06 NOTE — Progress Notes (Signed)
Subjective:     Patient ID: Antonio Watts, male   DOB: 10/17/1960, 56 y.o.   MRN: [id number]  HPI Patient seen requesting letter. He has history of obesity, hypertension, obstructive sleep apnea. He is requesting a letter stating that he must have at least 10 hours off-duty in order to have at least 8 hours of sleep due to sleep apnea. His current work schedule sometimes requires him getting up around 3 AM and sometimes works up to 14 hours per day. He has recently noticed some increased daytime somnolence. He is compliant with CPAP and uses nightly. Usually feels fairly rested when he gets up in the morning.  Past Medical History:  Diagnosis Date  . Hypertension   . Kidney calculi   . Kidney stones   . Low testosterone   . OSA (obstructive sleep apnea)    No past surgical history on file.  reports that he quit smoking about 5 years ago. His smoking use included Cigarettes. He has a 35.00 pack-year smoking history. He has never used smokeless tobacco. He reports that he does not drink alcohol or use drugs. family history includes Pulmonary fibrosis in his mother. Allergies  Allergen Reactions  . Doxycycline Hyclate     REACTION: rash, swelling     Review of Systems  Constitutional: Positive for fatigue. Negative for appetite change and unexpected weight change.  Eyes: Negative for visual disturbance.  Respiratory: Negative for cough, chest tightness and shortness of breath.   Cardiovascular: Negative for chest pain, palpitations and leg swelling.  Neurological: Negative for dizziness, syncope, weakness, light-headedness and headaches.       Objective:   Physical Exam  Constitutional: He is oriented to person, place, and time. He appears well-developed and well-nourished.  HENT:  Right Ear: External ear normal.  Left Ear: External ear normal.  Mouth/Throat: Oropharynx is clear and moist.  Eyes: Pupils are equal, round, and reactive to light.  Neck: Neck supple. No thyromegaly  present.  Cardiovascular: Normal rate and regular rhythm.   Pulmonary/Chest: Effort normal and breath sounds normal. No respiratory distress. He has no wheezes. He has no rales.  Musculoskeletal: He exhibits no edema.  Neurological: He is alert and oriented to person, place, and time.       Assessment:     #1 hypertension stable  #2 obstructive sleep apnea    Plan:     -Encouraged him to set up complete physical -letter for work given -Encouraged to lose some weight and we discussed strategies for that  Kristian CoveyBruce W Artavious Trebilcock MD [facility]

## 2016-08-06 NOTE — Patient Instructions (Signed)
Consider setting up complete physical later this year Try to lose some weight Consider free app for tracking calories such as myfitnesspal

## 2016-08-10 ENCOUNTER — Emergency Department (HOSPITAL_COMMUNITY)
Admission: EM | Admit: 2016-08-10 | Discharge: 2016-08-10 | Disposition: A | Discharge status: Home / Self Care | Payer: BLUE CROSS/BLUE SHIELD | Attending: Emergency Medicine | Admitting: Emergency Medicine

## 2016-08-10 ENCOUNTER — Encounter (HOSPITAL_COMMUNITY): Payer: Self-pay | Admitting: Emergency Medicine

## 2016-08-10 DIAGNOSIS — Z79899 Other long term (current) drug therapy: Secondary | ICD-10-CM | POA: Insufficient documentation

## 2016-08-10 DIAGNOSIS — I10 Essential (primary) hypertension: Secondary | ICD-10-CM | POA: Insufficient documentation

## 2016-08-10 DIAGNOSIS — N50812 Left testicular pain: Secondary | ICD-10-CM | POA: Insufficient documentation

## 2016-08-10 DIAGNOSIS — N50811 Right testicular pain: Secondary | ICD-10-CM | POA: Insufficient documentation

## 2016-08-10 DIAGNOSIS — R319 Hematuria, unspecified: Secondary | ICD-10-CM | POA: Diagnosis not present

## 2016-08-10 DIAGNOSIS — Z87891 Personal history of nicotine dependence: Secondary | ICD-10-CM | POA: Diagnosis not present

## 2016-08-10 LAB — URINALYSIS, ROUTINE W REFLEX MICROSCOPIC
Bilirubin Urine: NEGATIVE
Glucose, UA: NEGATIVE mg/dL
Ketones, ur: NEGATIVE mg/dL
Leukocytes, UA: NEGATIVE
Nitrite: NEGATIVE
Protein, ur: 100 mg/dL — AB
Specific Gravity, Urine: 1.02 (ref 1.005–1.030)
WBC, UA: NONE SEEN WBC/hpf (ref 0–5)
pH: 5 (ref 5.0–8.0)

## 2016-08-10 MED ORDER — HYDROCODONE-ACETAMINOPHEN 5-325 MG PO TABS: 1.0000 | ORAL_TABLET | ORAL | 0 refills | Status: DC | PRN

## 2016-08-10 NOTE — Discharge Instructions (Signed)
Take your medication as prescribed as needed for pain relief. Continue drinking fluids at home to remain hydrated. I recommend calling the urology clinic listed below on Monday morning to schedule a follow-up appointment for follow-up evaluation. Please return to the Emergency Department if symptoms worsen or new onset of fever, abdominal pain, flank pain, vomiting, worsening bleeding/blood in urine, unable to urinate, swelling to penis/testicles, penile discharge.

## 2016-08-10 NOTE — ED Provider Notes (Signed)
[facility]-EMERGENCY DEPT Provider Note   CSN: [id number] Arrival date & time: 08/10/16  1019     History   Chief Complaint Chief Complaint  Patient presents with  . Hematuria  . Testicle Pain    HPI Antonio Watts is a 56 y.o. male.  HPI   Patient is a 56 year old male with no pertinent past medical history presents the ED with complaint of hematuria and testicular pain, onset this morning. Patient states when he woke up this morning and and use the restroom he noticed his urine was a light pink in color. He notes throughout the day his urine has become more red. Patient also states he began having gradual onset bilateral testicular pain which has since remained constant and waxing and waning in nature. He notes the pain seems to be mildly alleviated when he is standing up ambulating. He also states he feels like he is having trouble urinating but notes 3 time he goes to use the restroom he has been able to urinate a small amount. Denies having similar symptoms in the past. Denies taking any medications for his symptoms prior to arrival. Denies fever, chills, chest pain, shortness of breath, abdominal pain, vomiting, diarrhea, blood in stool, flank pain, penile discharge, penile or testicular swelling, rash. Denies hx of recent groin injury/trauma. Denies history of abdominal surgeries. Patient reports having kidney stones in the past but notes his symptoms today are not consistent with his previous episode of kidney stones.  Past Medical History:  Diagnosis Date  . Hypertension   . Kidney calculi   . Kidney stones   . Low testosterone   . OSA (obstructive sleep apnea)     Patient Active Problem List   Diagnosis Date Noted  . Severe obesity (BMI >= 40) (HCC) 08/19/2013  . Hypertension 03/30/2012  . Low testosterone 01/29/2012  . OSA (obstructive sleep apnea) 01/15/2012  . Cellulitis and abscess 09/29/2010  . NEPHROLITHIASIS 01/23/2009  . KERATOSIS 01/23/2009  . PERS HX TOBACCO  USE PRESENTING HAZARDS HEALTH 01/23/2009    History reviewed. No pertinent surgical history.     Home Medications    Prior to Admission medications   Medication Sig Start Date End Date Taking? Authorizing Provider  b complex vitamins tablet Take 1 tablet by mouth daily.    [provider]  HYDROcodone-acetaminophen (NORCO/VICODIN) 5-325 MG tablet Take 1 tablet by mouth every 4 (four) hours as needed. 08/10/16   Barrett Henle, PA-C  ibuprofen (ADVIL,MOTRIN) 200 MG tablet Take 600 mg by mouth daily as needed for moderate pain.    [provider]  lisinopril (PRINIVIL,ZESTRIL) 10 MG tablet TAKE 1 TABLET (10 MG TOTAL) BY MOUTH DAILY. 04/15/16   Burchette, Elberta Fortis, MD  loratadine (CLARITIN) 10 MG tablet Take 10 mg by mouth daily.    [provider]  Multiple Vitamin (MULTIVITAMIN WITH MINERALS) TABS tablet Take 1 tablet by mouth daily.    [provider]  NON FORMULARY CPAP at bedtime     [provider]  Probiotic CAPS Take 1 capsule by mouth daily.    [provider]  Pyridoxine HCl (B-6 PO) Take 1 tablet by mouth daily.    [provider]    Family History Family History  Problem Relation Age of Onset  . Cancer Unknown        fhx  . Heart disease Unknown        fhx  . Heart failure Unknown   . Pulmonary fibrosis Mother  Social History Social History  Substance Use Topics  . Smoking status: Former Smoker    Packs/day: 1.00    Years: 35.00    Types: Cigarettes    Quit date: 05/26/2011  . Smokeless tobacco: Never Used  . Alcohol use No     Allergies   Doxycycline hyclate   Review of Systems Review of Systems  Genitourinary: Positive for difficulty urinating, hematuria and testicular pain.  All other systems reviewed and are negative.    Physical Exam Updated Vital Signs BP (!) 151/91 (BP Location: Left Arm)   Pulse (!) 55   Temp 98.3 F (36.8 C) (Oral)   Resp 17   Ht 5\' 9"  (1.753 m)    Wt (!) 342 lb (155.1 kg)   SpO2 97%   BMI 50.50 kg/m   Physical Exam  Constitutional: He is oriented to person, place, and time. He appears well-developed and well-nourished. No distress.  HENT:  Head: Normocephalic and atraumatic.  Mouth/Throat: Oropharynx is clear and moist. No oropharyngeal exudate.  Eyes: Conjunctivae and EOM are normal. Right eye exhibits no discharge. Left eye exhibits no discharge. No scleral icterus.  Neck: Normal range of motion. Neck supple.  Cardiovascular: Normal rate, regular rhythm, normal heart sounds and intact distal pulses.   Pulmonary/Chest: Effort normal and breath sounds normal. No respiratory distress. He has no wheezes. He has no rales. He exhibits no tenderness.  Abdominal: Soft. Normal appearance and bowel sounds are normal. He exhibits no distension and no mass. There is no tenderness. There is no rigidity, no rebound, no guarding and no CVA tenderness. No hernia. Hernia confirmed negative in the right inguinal area and confirmed negative in the left inguinal area.  Genitourinary: Penis normal. Right testis shows tenderness (mild). Right testis shows no mass and no swelling. Left testis shows tenderness (mild). Left testis shows no mass and no swelling. Circumcised. No penile erythema or penile tenderness. No discharge found.  Musculoskeletal: He exhibits no edema.  Lymphadenopathy: No inguinal adenopathy noted on the right or left side.  Neurological: He is alert and oriented to person, place, and time.  Skin: Skin is warm and dry. He is not diaphoretic.  Nursing note and vitals reviewed.    ED Treatments / Results  Labs (all labs ordered are listed, but only abnormal results are displayed) Labs Reviewed  URINALYSIS, ROUTINE W REFLEX MICROSCOPIC - Abnormal; Notable for the following:       Result Value   Color, Urine AMBER (*)    APPearance CLOUDY (*)    Hgb urine dipstick LARGE (*)    Protein, ur 100 (*)    Bacteria, UA RARE (*)     Squamous Epithelial / LPF 6-30 (*)    Non Squamous Epithelial 0-5 (*)    All other components within normal limits    EKG  EKG Interpretation None       Radiology No results found.  Procedures Procedures (including critical care time)  Medications Ordered in ED Medications - No data to display   Initial Impression / Assessment and Plan / ED Course  I have reviewed the triage vital signs and the nursing notes.  Pertinent labs & imaging results that were available during my care of the patient were reviewed by me and considered in my medical decision making (see chart for details).     Patient presents with hematuria that started this morning with associated bilateral testicular pain. Denies history of similar symptoms. Denies fever, abdominal pain, flank pain, vomiting.  VSS. Exam revealed mild tenderness over bilateral testicles without swelling or palpable mass. Abdomen soft and nontender. Remaining exam unremarkable. UA positive for large hemoglobin, 2 numerous to count RBCs; no signs of infection. Discussed patient with Dr. Rennis Chris who also evaluated the patient in the ED. During reevaluation patient denied having any pain. Plan to discharge patient home with symptomatic treatment and outpatient urology follow-up for further evaluation of hematuria. Discussed circumflex return precautions.  [place] Controlled Substance reporting System queried, pt filled rx of 5-325mg  vicodin #30 on 03/11/16, otherwise unremarkable.   Final Clinical Impressions(s) / ED Diagnoses   Final diagnoses:  Hematuria, unspecified type    New Prescriptions New Prescriptions   HYDROCODONE-ACETAMINOPHEN (NORCO/VICODIN) 5-325 MG TABLET    Take 1 tablet by mouth every 4 (four) hours as needed.       Barrett Henle, PA-C 08/10/16 1410    Doug Sou, MD 08/10/16 [phone]

## 2016-08-10 NOTE — ED Triage Notes (Signed)
Pt. Stated, I woke up this morning and had blood in my urine and testicle pain.

## 2016-08-12 DIAGNOSIS — R8271 Bacteriuria: Secondary | ICD-10-CM | POA: Diagnosis not present

## 2016-08-12 DIAGNOSIS — R310 Gross hematuria: Secondary | ICD-10-CM | POA: Diagnosis not present

## 2016-08-12 LAB — PSA: PSA: 0.2

## 2016-08-16 ENCOUNTER — Encounter: Payer: Self-pay | Admitting: Family Medicine

## 2016-08-20 DIAGNOSIS — R310 Gross hematuria: Secondary | ICD-10-CM | POA: Diagnosis not present

## 2016-08-20 DIAGNOSIS — N132 Hydronephrosis with renal and ureteral calculous obstruction: Secondary | ICD-10-CM | POA: Diagnosis not present

## 2016-08-21 DIAGNOSIS — N442 Benign cyst of testis: Secondary | ICD-10-CM | POA: Diagnosis not present

## 2016-08-21 DIAGNOSIS — N201 Calculus of ureter: Secondary | ICD-10-CM | POA: Diagnosis not present

## 2016-09-02 DIAGNOSIS — N201 Calculus of ureter: Secondary | ICD-10-CM | POA: Diagnosis not present

## 2016-09-05 ENCOUNTER — Encounter: Payer: Self-pay | Admitting: Family Medicine

## 2016-09-10 DIAGNOSIS — G4733 Obstructive sleep apnea (adult) (pediatric): Secondary | ICD-10-CM | POA: Diagnosis not present

## 2016-09-11 DIAGNOSIS — N201 Calculus of ureter: Secondary | ICD-10-CM | POA: Diagnosis not present

## 2016-10-15 ENCOUNTER — Other Ambulatory Visit: Payer: Self-pay | Admitting: Urology

## 2016-10-15 DIAGNOSIS — N442 Benign cyst of testis: Secondary | ICD-10-CM

## 2016-10-18 ENCOUNTER — Ambulatory Visit (HOSPITAL_COMMUNITY): Payer: BLUE CROSS/BLUE SHIELD

## 2016-10-21 ENCOUNTER — Ambulatory Visit (HOSPITAL_COMMUNITY)
Admission: RE | Admit: 2016-10-21 | Discharge: 2016-10-21 | Disposition: A | Discharge status: Home / Self Care | Payer: BLUE CROSS/BLUE SHIELD | Source: Ambulatory Visit | Attending: Urology | Admitting: Urology

## 2016-10-21 DIAGNOSIS — N442 Benign cyst of testis: Secondary | ICD-10-CM | POA: Insufficient documentation

## 2016-10-21 DIAGNOSIS — I861 Scrotal varices: Secondary | ICD-10-CM | POA: Insufficient documentation

## 2016-10-21 DIAGNOSIS — N433 Hydrocele, unspecified: Secondary | ICD-10-CM | POA: Diagnosis not present

## 2016-11-22 DIAGNOSIS — N442 Benign cyst of testis: Secondary | ICD-10-CM | POA: Diagnosis not present

## 2016-12-23 ENCOUNTER — Other Ambulatory Visit: Payer: Self-pay | Admitting: *Deleted

## 2016-12-23 DIAGNOSIS — G4733 Obstructive sleep apnea (adult) (pediatric): Secondary | ICD-10-CM | POA: Diagnosis not present

## 2016-12-23 MED ORDER — LISINOPRIL 10 MG PO TABS: 10.0000 mg | ORAL_TABLET | Freq: Every day | ORAL | 1 refills | Status: DC

## 2017-02-21 ENCOUNTER — Ambulatory Visit: Payer: BLUE CROSS/BLUE SHIELD | Admitting: Family Medicine

## 2017-02-21 ENCOUNTER — Encounter: Payer: Self-pay | Admitting: Family Medicine

## 2017-02-21 VITALS — BP 118/76 | HR 66 | Temp 98.3°F | Ht 69.0 in

## 2017-02-21 DIAGNOSIS — M545 Low back pain, unspecified: Secondary | ICD-10-CM

## 2017-02-21 MED ORDER — NAPROXEN 500 MG PO TABS: ORAL_TABLET | ORAL | 0 refills | Status: DC

## 2017-02-21 MED ORDER — CYCLOBENZAPRINE HCL 10 MG PO TABS: ORAL_TABLET | ORAL | 0 refills | Status: DC

## 2017-02-21 NOTE — Progress Notes (Signed)
HPI:  Antonio Watts is a very pleasant 56 year old here for an acute visit for back pain.  Reports he slipped in some mud and stepped wrong earlier today suddenly had sharp pain in the R low back.  Worsened over the next few hours.  Now is moderate to severe in nature, and sharp with certain movements.  He denies radiation of the pain to the buttock or the lower extremity, weakness or numbness in the lower extremities, bowel or bladder dysfunction, diarrhea, fever or malaise.  He has a history of back injury and back pain in the past, though had been doing well.  He does not do any heavy lifting or strenuous activities with his job.    ROS: See pertinent positives and negatives per HPI.  Past Medical History:  Diagnosis Date  . Hypertension   . Kidney calculi   . Kidney stones   . Low testosterone   . OSA (obstructive sleep apnea)     History reviewed. No pertinent surgical history.  Family History  Problem Relation Age of Onset  . Cancer Unknown        fhx  . Heart disease Unknown        fhx  . Heart failure Unknown   . Pulmonary fibrosis Mother     Social History   Socioeconomic History  . Marital status: Married    Spouse name: None  . Number of children: None  . Years of education: None  . Highest education level: None  Social Needs  . Financial resource strain: None  . Food insecurity - worry: None  . Food insecurity - inability: None  . Transportation needs - medical: None  . Transportation needs - non-medical: None  Occupational History  . None  Tobacco Use  . Smoking status: Former Smoker    Packs/day: 1.00    Years: 35.00    Pack years: 35.00    Types: Cigarettes    Last attempt to quit: 05/26/2011    Years since quitting: 5.7  . Smokeless tobacco: Never Used  Substance and Sexual Activity  . Alcohol use: No  . Drug use: No  . Sexual activity: None  Other Topics Concern  . None  Social History Narrative  . None     Current Outpatient  Medications:  .  b complex vitamins tablet, Take 1 tablet by mouth daily., Disp: , Rfl:  .  HYDROcodone-acetaminophen (NORCO/VICODIN) 5-325 MG tablet, Take 1-2 tablet by mouth every 6 hours as needed., Disp: , Rfl:  .  ibuprofen (ADVIL,MOTRIN) 200 MG tablet, Take 600 mg by mouth daily as needed for moderate pain., Disp: , Rfl:  .  lisinopril (PRINIVIL,ZESTRIL) 10 MG tablet, Take 1 tablet (10 mg total) by mouth daily., Disp: 90 tablet, Rfl: 1 .  loratadine (CLARITIN) 10 MG tablet, Take 10 mg by mouth daily., Disp: , Rfl:  .  Multiple Vitamin (MULTIVITAMIN WITH MINERALS) TABS tablet, Take 1 tablet by mouth daily., Disp: , Rfl:  .  NON FORMULARY, CPAP at bedtime , Disp: , Rfl:  .  Probiotic CAPS, Take 1 capsule by mouth daily., Disp: , Rfl:  .  Pyridoxine HCl (B-6 PO), Take 1 tablet by mouth daily., Disp: , Rfl:  .  cyclobenzaprine (FLEXERIL) 10 MG tablet, Nightly as needed for muscle spasm, Disp: 20 tablet, Rfl: 0 .  naproxen (NAPROSYN) 500 MG tablet, Every 12 hours with meals as needed for pain, Disp: 20 tablet, Rfl: 0  EXAM:  Vitals:   02/21/17 1502  BP: 118/76  Pulse: 66  Temp: 98.3 F (36.8 C)    Body mass index is 50.5 kg/m.  GENERAL: vitals reviewed and listed above, alert, oriented, appears well hydrated and in no acute distress  HEENT: atraumatic, conjunttiva clear, no obvious abnormalities on inspection of external nose and ears  NECK: no obvious masses on inspection  LUNGS: clear to auscultation bilaterally, no wheezes, rales or rhonchi, good air movement  CV: HRRR, no peripheral edema  MS: moves all extremities without noticeable abnormality Normal Gait Normal inspection of back, no obvious scoliosis or leg length descrepancy No bony TTP Soft tissue TTP at: R SI jt region -/+ tests: neg trendelenburg,-facet loading, -SLRT, -CLRT, -FABER, -FADIR Normal muscle strength, sensation to light touch and DTRs in LEs bilaterally  PSYCH: pleasant and cooperative, no obvious  depression or anxiety  ASSESSMENT AND PLAN:  Discussed the following assessment and plan:  Acute right-sided low back pain without sciatica  -we discussed possible serious and likely etiologies, workup and treatment, treatment risks and return precautions -suspect SI strain -after this discussion, Antonio Watts opted for home exercises, symptomatic care patient instructions -follow up advised in 2-3 weeks, sooner as needed -of course, we advised Antonio Watts  to return or notify a doctor promptly if symptoms worsen or persist or new concerns arise.  We discussed alarm symptoms emergency care as well.   Patient Instructions  BEFORE YOU LEAVE: -SI exercises -follow up: 2-4 weeks with his primary doctor  Take the Flexeril at night if needed for muscle spasm.  Use heat twice daily and topical menthol (Tiger balm is a good product) as needed for pain.  Use the naproxen regions as needed for pain as well.  Do the exercises provided at least 4 days/week.  I hope you are feeling better soon! Seek care promptly if your symptoms worsen, new concerns arise or you are not improving with treatment.     Kriste BasqueKIM, HANNAH R., DO

## 2017-02-21 NOTE — Patient Instructions (Signed)
BEFORE YOU LEAVE: -SI exercises -follow up: 2-4 weeks with his primary doctor  Take the Flexeril at night if needed for muscle spasm.  Use heat twice daily and topical menthol (Tiger balm is a good product) as needed for pain.  Use the naproxen regions as needed for pain as well.  Do the exercises provided at least 4 days/week.  I hope you are feeling better soon! Seek care promptly if your symptoms worsen, new concerns arise or you are not improving with treatment.

## 2017-02-25 ENCOUNTER — Other Ambulatory Visit: Payer: Self-pay | Admitting: *Deleted

## 2017-02-25 MED ORDER — NAPROXEN 500 MG PO TABS
ORAL_TABLET | ORAL | 0 refills | Status: DC
Start: 2017-02-25 — End: 2018-10-27

## 2017-02-25 NOTE — Telephone Encounter (Signed)
Rx done. 

## 2017-02-25 NOTE — Telephone Encounter (Signed)
Refill once OK. 

## 2017-02-25 NOTE — Telephone Encounter (Signed)
Pharmacy requested a refill on Naproxen-last given by Dr Selena BattenKim at acute visit on 11/30.

## 2017-03-09 ENCOUNTER — Other Ambulatory Visit: Payer: Self-pay | Admitting: Family Medicine

## 2017-03-10 NOTE — Telephone Encounter (Signed)
Rx done. 

## 2017-03-10 NOTE — Telephone Encounter (Signed)
Refill once 

## 2017-07-19 ENCOUNTER — Other Ambulatory Visit: Payer: Self-pay | Admitting: Family Medicine

## 2017-10-19 ENCOUNTER — Other Ambulatory Visit: Payer: Self-pay | Admitting: Family Medicine

## 2017-10-23 ENCOUNTER — Other Ambulatory Visit: Payer: Self-pay | Admitting: Family Medicine

## 2017-11-03 ENCOUNTER — Ambulatory Visit: Payer: BLUE CROSS/BLUE SHIELD | Admitting: Family Medicine

## 2017-11-03 VITALS — BP 120/80 | HR 62 | Temp 98.2°F | Wt 318.2 lb

## 2017-11-03 DIAGNOSIS — I10 Essential (primary) hypertension: Secondary | ICD-10-CM | POA: Diagnosis not present

## 2017-11-03 MED ORDER — LISINOPRIL 10 MG PO TABS: 10.0000 mg | ORAL_TABLET | Freq: Every day | ORAL | 3 refills | Status: DC

## 2017-11-03 NOTE — Patient Instructions (Signed)
Consider setting up complete physical.     

## 2017-11-03 NOTE — Progress Notes (Signed)
  Subjective:     Patient ID: Antonio Watts, male   DOB: 06/16/1960, 57 y.o.   MRN: [id number]  HPI Patient is seen for follow-up hypertension. Takes lisinopril 10 mg daily. No recent headaches or chest pain. Generally feels well. Has not had a physical now since 2016. Compliant with medication. Denies any side effects.  Past Medical History:  Diagnosis Date  . Hypertension   . Kidney calculi   . Kidney stones   . Low testosterone   . OSA (obstructive sleep apnea)    No past surgical history on file.  reports that he quit smoking about 6 years ago. His smoking use included cigarettes. He has a 35.00 pack-year smoking history. He has never used smokeless tobacco. He reports that he does not drink alcohol or use drugs. family history includes Cancer in his unknown relative; Heart disease in his unknown relative; Heart failure in his unknown relative; Pulmonary fibrosis in his mother. Allergies  Allergen Reactions  . Doxycycline Hyclate     REACTION: rash, swelling     Review of Systems  Constitutional: Negative for fatigue.  Eyes: Negative for visual disturbance.  Respiratory: Negative for cough, chest tightness and shortness of breath.   Cardiovascular: Negative for chest pain, palpitations and leg swelling.  Neurological: Negative for dizziness, syncope, weakness, light-headedness and headaches.       Objective:   Physical Exam  Constitutional: He is oriented to person, place, and time. He appears well-developed and well-nourished.  HENT:  Right Ear: External ear normal.  Left Ear: External ear normal.  Mouth/Throat: Oropharynx is clear and moist.  Eyes: Pupils are equal, round, and reactive to light.  Neck: Neck supple. No thyromegaly present.  Cardiovascular: Normal rate and regular rhythm.  Pulmonary/Chest: Effort normal and breath sounds normal. No respiratory distress. He has no wheezes. He has no rales.  Musculoskeletal: He exhibits no edema.  Neurological: He is  alert and oriented to person, place, and time.       Assessment:     Hypertension. Stable and at goal    Plan:     -Refill lisinopril for one year -Recommend set up complete physical  Antonio CoveyBruce W Emmagene Ortner MD [facility]

## 2017-11-12 DIAGNOSIS — G4733 Obstructive sleep apnea (adult) (pediatric): Secondary | ICD-10-CM | POA: Diagnosis not present

## 2017-11-14 ENCOUNTER — Ambulatory Visit (INDEPENDENT_AMBULATORY_CARE_PROVIDER_SITE_OTHER): Payer: BLUE CROSS/BLUE SHIELD

## 2017-11-14 ENCOUNTER — Encounter: Payer: Self-pay | Admitting: Family Medicine

## 2017-11-14 ENCOUNTER — Ambulatory Visit (INDEPENDENT_AMBULATORY_CARE_PROVIDER_SITE_OTHER): Payer: BLUE CROSS/BLUE SHIELD | Admitting: Family Medicine

## 2017-11-14 ENCOUNTER — Other Ambulatory Visit: Payer: Self-pay | Admitting: Family Medicine

## 2017-11-14 VITALS — BP 124/84 | HR 53 | Temp 97.9°F | Ht 68.0 in | Wt 317.5 lb

## 2017-11-14 DIAGNOSIS — Z23 Encounter for immunization: Secondary | ICD-10-CM | POA: Diagnosis not present

## 2017-11-14 DIAGNOSIS — M25551 Pain in right hip: Secondary | ICD-10-CM

## 2017-11-14 DIAGNOSIS — Z125 Encounter for screening for malignant neoplasm of prostate: Secondary | ICD-10-CM | POA: Diagnosis not present

## 2017-11-14 DIAGNOSIS — Z0000 Encounter for general adult medical examination without abnormal findings: Secondary | ICD-10-CM | POA: Diagnosis not present

## 2017-11-14 LAB — LIPID PANEL
Cholesterol: 158 mg/dL (ref 0–200)
HDL: 34.4 mg/dL — ABNORMAL LOW (ref >39.00)
LDL Cholesterol: 103 mg/dL — ABNORMAL HIGH (ref 0–99)
NonHDL: 123.55
Total CHOL/HDL Ratio: 5
Triglycerides: 104 mg/dL (ref 0.0–149.0)
VLDL: 20.8 mg/dL (ref 0.0–40.0)

## 2017-11-14 LAB — BASIC METABOLIC PANEL
BUN: 17 mg/dL (ref 6–23)
CO2: 26 mEq/L (ref 19–32)
Calcium: 10.2 mg/dL (ref 8.4–10.5)
Chloride: 108 mEq/L (ref 96–112)
Creatinine, Ser: 0.96 mg/dL (ref 0.40–1.50)
GFR: 85.39 mL/min (ref >60.00)
Glucose, Bld: 85 mg/dL (ref 70–99)
Potassium: 4.3 mEq/L (ref 3.5–5.1)
Sodium: 142 mEq/L (ref 135–145)

## 2017-11-14 LAB — CBC WITH DIFFERENTIAL/PLATELET
Basophils Absolute: 0 10*3/uL (ref 0.0–0.1)
Basophils Relative: 0.6 % (ref 0.0–3.0)
Eosinophils Absolute: 0.3 10*3/uL (ref 0.0–0.7)
Eosinophils Relative: 3.9 % (ref 0.0–5.0)
HCT: 44.8 % (ref 39.0–52.0)
Hemoglobin: 14.5 g/dL (ref 13.0–17.0)
Lymphocytes Relative: 37.5 % (ref 12.0–46.0)
Lymphs Abs: 3 10*3/uL (ref 0.7–4.0)
MCHC: 32.3 g/dL (ref 30.0–36.0)
MCV: 75 fl — ABNORMAL LOW (ref 78.0–100.0)
Monocytes Absolute: 0.6 10*3/uL (ref 0.1–1.0)
Monocytes Relative: 7.6 % (ref 3.0–12.0)
Neutro Abs: 4 10*3/uL (ref 1.4–7.7)
Neutrophils Relative %: 50.4 % (ref 43.0–77.0)
Platelets: 222 10*3/uL (ref 150.0–400.0)
RBC: 5.97 Mil/uL — ABNORMAL HIGH (ref 4.22–5.81)
RDW: 16.5 % — ABNORMAL HIGH (ref 11.5–15.5)
WBC: 7.9 10*3/uL (ref 4.0–10.5)

## 2017-11-14 LAB — PSA: PSA: 0.37 ng/mL (ref 0.10–4.00)

## 2017-11-14 LAB — TSH: TSH: 1.65 u[IU]/mL (ref 0.35–4.50)

## 2017-11-14 LAB — HEPATIC FUNCTION PANEL
ALT: 21 U/L (ref 0–53)
AST: 19 U/L (ref 0–37)
Albumin: 4.2 g/dL (ref 3.5–5.2)
Alkaline Phosphatase: 63 U/L (ref 39–117)
Bilirubin, Direct: 0.2 mg/dL (ref 0.0–0.3)
Total Bilirubin: 0.9 mg/dL (ref 0.2–1.2)
Total Protein: 7 g/dL (ref 6.0–8.3)

## 2017-11-14 NOTE — Progress Notes (Signed)
Subjective:     Patient ID: Antonio Watts, male   DOB: 03/12/1961, 57 y.o.   MRN: [id number]  HPI Patient seen for physical. His chronic problems include history of morbid obesity, hypertension, obstructive sleep apnea, past history of nicotine use, history of kidney stones. Only regular prescription medication is lisinopril 10 mg daily. Blood pressure has been stable.  No history of hepatitis C screening. He does have several tattoos. No other risk factors. Colonoscopy 2011 with 10 year recommended follow-up. Tetanus 2011.  He's had some somewhat poorly localized right hip pain for several months. Denies injury. Symptoms worse with position change. Occasional low back pain.  Past Medical History:  Diagnosis Date  . Hypertension   . Kidney calculi   . Kidney stones   . Low testosterone   . OSA (obstructive sleep apnea)    No past surgical history on file.  reports that he quit smoking about 6 years ago. His smoking use included cigarettes. He has a 35.00 pack-year smoking history. He has never used smokeless tobacco. He reports that he does not drink alcohol or use drugs. family history includes Cancer in his unknown relative; Heart disease in his unknown relative; Heart failure in his unknown relative; Pulmonary fibrosis in his mother. Allergies  Allergen Reactions  . Doxycycline Hyclate     REACTION: rash, swelling       Review of Systems  Constitutional: Negative for activity change, appetite change, fatigue and fever.  HENT: Negative for congestion, ear pain and trouble swallowing.   Eyes: Negative for pain and visual disturbance.  Respiratory: Negative for cough, shortness of breath and wheezing.   Cardiovascular: Negative for chest pain and palpitations.  Gastrointestinal: Negative for abdominal distention, abdominal pain, blood in stool, constipation, diarrhea, nausea, rectal pain and vomiting.  Genitourinary: Negative for dysuria, hematuria and testicular pain.   Musculoskeletal: Negative for arthralgias and joint swelling.  Skin: Negative for rash.  Neurological: Negative for dizziness, syncope and headaches.  Hematological: Negative for adenopathy.  Psychiatric/Behavioral: Negative for confusion and dysphoric mood.       Objective:   Physical Exam  Constitutional: He is oriented to person, place, and time. He appears well-developed and well-nourished. No distress.  HENT:  Head: Normocephalic and atraumatic.  Right Ear: External ear normal.  Left Ear: External ear normal.  Mouth/Throat: Oropharynx is clear and moist.  Eyes: Pupils are equal, round, and reactive to light. Conjunctivae and EOM are normal.  Neck: Normal range of motion. Neck supple. No thyromegaly present.  Cardiovascular: Normal rate, regular rhythm and normal heart sounds.  No murmur heard. Pulmonary/Chest: No respiratory distress. He has no wheezes. He has no rales.  Abdominal: Soft. Bowel sounds are normal. He exhibits no distension and no mass. There is no tenderness. There is no rebound and no guarding.  Musculoskeletal: He exhibits no edema.   some pain with external rotation right hip.  Lymphadenopathy:    He has no cervical adenopathy.  Neurological: He is alert and oriented to person, place, and time. He displays normal reflexes. No cranial nerve deficit.  Skin: No rash noted.  Psychiatric: He has a normal mood and affect.       Assessment:     #1 Physical exam. Several health maintenance issues addressed as below  #2 right hip pain    Plan:     -x-ray right hip reveals some very mild degenerative changes bilaterally but no other acute abnormalities. This will be over read -We recommended try to lose  some weight -Flu vaccine given -Obtain screening lab work -Check hepatitis C antibody -consider new shingles vaccine- he will check on coverage.  Kristian Covey MD [facility] at [facility]

## 2017-11-15 LAB — HEPATITIS C ANTIBODY
Hepatitis C Ab: NONREACTIVE
SIGNAL TO CUT-OFF: 0.02 (ref <1.00)

## 2017-11-17 ENCOUNTER — Ambulatory Visit: Payer: BLUE CROSS/BLUE SHIELD | Admitting: Family Medicine

## 2017-12-18 ENCOUNTER — Encounter: Payer: Self-pay | Admitting: Family Medicine

## 2017-12-18 ENCOUNTER — Ambulatory Visit: Payer: BLUE CROSS/BLUE SHIELD | Admitting: Family Medicine

## 2017-12-18 VITALS — BP 136/80 | HR 58 | Temp 98.0°F | Wt 320.2 lb

## 2017-12-18 DIAGNOSIS — S39012A Strain of muscle, fascia and tendon of lower back, initial encounter: Secondary | ICD-10-CM

## 2017-12-18 MED ORDER — METHYLPREDNISOLONE 4 MG PO TBPK: ORAL_TABLET | ORAL | 0 refills | Status: DC

## 2017-12-18 NOTE — Progress Notes (Signed)
   Subjective:    Patient ID: Antonio Watts, male    DOB: 03/16/1961, 57 y.o.   MRN: [id number]  HPI Here for 3 days of stiffness and pain in the lower back. No recent trauma. 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 He has tried ice and heat. He is taking Flexeril and Ibuprofen with mixed results. No pain or numbness in the legs.    Review of Systems  Constitutional: Negative.   Respiratory: Negative.   Cardiovascular: Negative.   Musculoskeletal: Positive for back pain.  Neurological: Negative.        Objective:   Physical Exam  Constitutional: He is oriented to person, place, and time.  In pain   Cardiovascular: Normal rate, regular rhythm, normal heart sounds and intact distal pulses.  Pulmonary/Chest: Effort normal and breath sounds normal.  Musculoskeletal:  Tender throughout the lower back. ROM is limited by pain. SLR is negative.   Neurological: He is alert and oriented to person, place, and time.          Assessment & Plan:  Lumbar strain, try a Medrol dose pack. Continue heat and Flexeril.  Gershon Crane, MD

## 2017-12-26 ENCOUNTER — Ambulatory Visit: Payer: BLUE CROSS/BLUE SHIELD | Admitting: Family Medicine

## 2017-12-26 ENCOUNTER — Encounter: Payer: Self-pay | Admitting: Family Medicine

## 2017-12-26 ENCOUNTER — Other Ambulatory Visit: Payer: Self-pay

## 2017-12-26 VITALS — BP 128/78 | HR 70 | Temp 98.3°F | Ht 68.0 in | Wt 318.2 lb

## 2017-12-26 DIAGNOSIS — M545 Low back pain, unspecified: Secondary | ICD-10-CM

## 2017-12-26 NOTE — Progress Notes (Signed)
  Subjective:     Patient ID: Antonio Watts, male   DOB: [birth year], 57 y.o.   MRN: [id number]  HPI Patient seen with low back pain near the midline which has been going on for little over a week.  Sometimes dull sometimes sharp.  Was seen here last week and given Medrol Dosepak and has been taking some Flexeril and trying heat and ice without improvement.  He has no radiation into the buttocks or lower extremities.  No numbness.  No weakness.  No urine or stool incontinence.  His job requires driving concrete mixing truck.  Frequently gets in and out.  Pain is actually better with walking.  Past Medical History:  Diagnosis Date  . Hypertension   . Kidney calculi   . Kidney stones   . Low testosterone   . OSA (obstructive sleep apnea)    History reviewed. No pertinent surgical history.  reports that he quit smoking about 6 years ago. His smoking use included cigarettes. He has a 35.00 pack-year smoking history. He has never used smokeless tobacco. He reports that he does not drink alcohol or use drugs. family history includes Cancer in his unknown relative; Heart disease in his unknown relative; Heart failure in his unknown relative; Pulmonary fibrosis in his mother. Allergies  Allergen Reactions  . Doxycycline Hyclate     REACTION: rash, swelling     Review of Systems  Constitutional: Negative for activity change, appetite change and fever.  Respiratory: Negative for cough and shortness of breath.   Cardiovascular: Negative for chest pain and leg swelling.  Gastrointestinal: Negative for abdominal pain and vomiting.  Genitourinary: Negative for dysuria, flank pain and hematuria.  Musculoskeletal: Positive for back pain. Negative for joint swelling.  Neurological: Negative for weakness and numbness.       Objective:   Physical Exam  Constitutional: He is oriented to person, place, and time. He appears well-developed and well-nourished. No distress.  Neck: No thyromegaly  present.  Cardiovascular: Normal rate, regular rhythm and normal heart sounds.  No murmur heard. Pulmonary/Chest: Effort normal and breath sounds normal. No respiratory distress. He has no wheezes. He has no rales.  Musculoskeletal: He exhibits no edema.  Neurological: He is alert and oriented to person, place, and time. He has normal reflexes. No cranial nerve deficit.  Skin: No rash noted.       Assessment:     Low back pain.  Nonfocal exam neurologically.  Suspect musculoskeletal strain.    Plan:     -Continue heat or ice for symptom relief -We reviewed some extension stretches to work on at home -Continue walking as tolerated -He will try over-the-counter anti-inflammatories but cautioned against regular use as they have not seemed to help much thus far -Follow-up immediately for any weakness or numbness or progressive pain -Consider physical therapy if not improving over the next couple weeks  Kristian Covey MD [facility] at [facility]

## 2017-12-26 NOTE — Patient Instructions (Signed)
Try the extension back stretch that we did here in office  Try to work on hamstring flexibility.  Walk as much as possible.   Work on Cytogeneticist.  Let's consider some physical therapy if no better in 2 weeks.

## 2018-01-26 ENCOUNTER — Other Ambulatory Visit: Payer: Self-pay | Admitting: Family Medicine

## 2018-01-26 MED ORDER — LISINOPRIL 10 MG PO TABS: 10.0000 mg | ORAL_TABLET | Freq: Every day | ORAL | 3 refills | Status: DC

## 2018-01-26 NOTE — Telephone Encounter (Signed)
Copied from CRM [id number]. Topic: Quick Communication - Rx Refill/Question >> Jan 26, 2018  8:42 AM Baldo Daub L wrote: Medication: lisinopril (PRINIVIL,ZESTRIL) 10 MG tablet  Has the patient contacted their pharmacy? Yes - new pharmacy, needs new script called in (Agent: If no, request that the patient contact the pharmacy for the refill.) (Agent: If yes, when and what did the pharmacy advise?)  Preferred Pharmacy (with phone number or street name): [id number] [place], [place] - [phone] AT [facility] [address] & [address] [phone] (Phone) [phone] (Fax)  Agent: Please be advised that RX refills may take up to 3 business days. We ask that you follow-up with your pharmacy.

## 2018-03-11 ENCOUNTER — Ambulatory Visit: Payer: BLUE CROSS/BLUE SHIELD | Admitting: Family Medicine

## 2018-03-11 ENCOUNTER — Encounter: Payer: Self-pay | Admitting: Family Medicine

## 2018-03-11 ENCOUNTER — Other Ambulatory Visit: Payer: Self-pay

## 2018-03-11 VITALS — BP 138/78 | HR 66 | Temp 98.3°F | Ht 68.0 in | Wt 323.7 lb

## 2018-03-11 DIAGNOSIS — K140 Glossitis: Secondary | ICD-10-CM | POA: Diagnosis not present

## 2018-03-11 DIAGNOSIS — M546 Pain in thoracic spine: Secondary | ICD-10-CM | POA: Diagnosis not present

## 2018-03-11 DIAGNOSIS — L309 Dermatitis, unspecified: Secondary | ICD-10-CM

## 2018-03-11 MED ORDER — METHOCARBAMOL 750 MG PO TABS: 750.0000 mg | ORAL_TABLET | Freq: Four times a day (QID) | ORAL | 1 refills | Status: DC | PRN

## 2018-03-11 MED ORDER — TRIAMCINOLONE ACETONIDE 0.1 % EX CREA: 1.0000 "application " | TOPICAL_CREAM | Freq: Two times a day (BID) | CUTANEOUS | 1 refills | Status: DC | PRN

## 2018-03-11 NOTE — Progress Notes (Signed)
  Subjective:     Patient ID: Zenaida NieceJoseph M Winters, male   DOB: 10/15/1960, 57 y.o.   MRN: [id number]  HPI Patient seen for several items as follows  Sore area left side of tongue.  Just noted about 4 days ago.  No obvious trauma but he thinks he may have bitten his tongue.  No sore throat.  No neck adenopathy.  Pain was worse after doing mouthwash  Second issue is slightly pruritic scaly rash medial aspect right ankle.  Present intermittently since the summer.  He tried some over-the-counter hydrocortisone cream without much improvement  Third issue is intermittent back pain lower thoracic area bilaterally.  He feels his muscles are tightening up at times.  He is tried heat and ice without much relief.  Past Medical History:  Diagnosis Date  . Hypertension   . Kidney calculi   . Kidney stones   . Low testosterone   . OSA (obstructive sleep apnea)    History reviewed. No pertinent surgical history.  reports that he quit smoking about 6 years ago. His smoking use included cigarettes. He has a 35.00 pack-year smoking history. He has never used smokeless tobacco. He reports that he does not drink alcohol or use drugs. family history includes Cancer in an other family member; Heart disease in an other family member; Heart failure in an other family member; Pulmonary fibrosis in his mother. Allergies  Allergen Reactions  . Doxycycline Hyclate     REACTION: rash, swelling     Review of Systems  Constitutional: Negative for appetite change, fever and unexpected weight change.  HENT: Negative for sore throat and trouble swallowing.   Skin: Positive for rash.  Hematological: Negative for adenopathy.       Objective:   Physical Exam Constitutional:      Appearance: Normal appearance.  HENT:     Mouth/Throat:     Comments: He has what appears to be a very small approximately 1 mm puncture type wound left side of the tongue.  No evidence for any leukoplakia.  No ulceration. Cardiovascular:      Rate and Rhythm: Normal rate and regular rhythm.  Pulmonary:     Effort: Pulmonary effort is normal.     Breath sounds: Normal breath sounds.  Skin:    Comments: Macular scaly rash which is fairly homogenous in about 3 cm diameter somewhat circular arrangement right medial ankle  Neurological:     Mental Status: He is alert.        Assessment:     #1 tongue soreness.  Suspect from trauma.  No leukoplakia or signs of cancer  #2 right ankle rash.  This appears to be more eczematous  #3 bilateral thoracic back pain.  Suspect muscular    Plan:     -Recommend saline rinses to mouth several times daily and touch base if tongue sore not fully healed in 2 to 3 weeks  -Triamcinolone 0.1% cream twice daily as needed to rash right ankle  -Robaxin 750 mg 1 every 6 hours as needed for muscle spasm.  He will also try some moist heat and muscle massage and stretches.  Cautioned about potential sedation with Robaxin.  Kristian CoveyBruce W Kamani Lewter MD [facility]

## 2018-03-11 NOTE — Patient Instructions (Signed)
Try some warm salt water rinses to mouth several times daily  Let me know if tongue sore not fully healed in 2-3 weeks.

## 2018-03-13 ENCOUNTER — Ambulatory Visit: Payer: BLUE CROSS/BLUE SHIELD | Admitting: Family Medicine

## 2018-03-23 ENCOUNTER — Encounter: Payer: Self-pay | Admitting: Family Medicine

## 2018-03-23 ENCOUNTER — Ambulatory Visit: Payer: BLUE CROSS/BLUE SHIELD | Admitting: Family Medicine

## 2018-03-23 VITALS — BP 140/78 | HR 66 | Temp 98.6°F | Wt 324.2 lb

## 2018-03-23 DIAGNOSIS — J209 Acute bronchitis, unspecified: Secondary | ICD-10-CM | POA: Diagnosis not present

## 2018-03-23 MED ORDER — AZITHROMYCIN 250 MG PO TABS: ORAL_TABLET | ORAL | 0 refills | Status: DC

## 2018-03-23 MED ORDER — HYDROCODONE-HOMATROPINE 5-1.5 MG/5ML PO SYRP: 5.0000 mL | ORAL_SOLUTION | ORAL | 0 refills | Status: DC | PRN

## 2018-03-23 NOTE — Progress Notes (Signed)
   Subjective:    Patient ID: Zenaida NieceJoseph M Scheller, male    DOB: 02/29/1961, 57 y.o.   MRN: [id number]  HPI Here for one week of stuffy head, PND, ST, chest tightness, wheezing, and coughing up yellow sputum. No fever. Using Dayquil and Nyquil.    Review of Systems  Constitutional: Negative.   HENT: Positive for congestion, postnasal drip and sore throat. Negative for sinus pressure and sinus pain.   Eyes: Negative.   Respiratory: Positive for cough, chest tightness, shortness of breath and wheezing.   Cardiovascular: Negative.        Objective:   Physical Exam Constitutional:      Appearance: Normal appearance.  HENT:     Right Ear: Tympanic membrane and ear canal normal.     Left Ear: Tympanic membrane and ear canal normal.     Nose: Nose normal.     Mouth/Throat:     Pharynx: Oropharynx is clear.  Eyes:     Conjunctiva/sclera: Conjunctivae normal.  Pulmonary:     Effort: Pulmonary effort is normal.     Breath sounds: No rales.     Comments: Scattered rhonchi and wheezing  Lymphadenopathy:     Cervical: No cervical adenopathy.  Neurological:     Mental Status: He is alert.           Assessment & Plan:  Bronchitis. He was given a nebulizer treatment with Duonebs. On re-exam he had better airflow and less wheezing. He is given a Zpack. Written out of work from 04-20-17 until 04-26-18. Gershon CraneStephen Marshall Roehrich, MD

## 2018-04-23 IMAGING — CR DG LUMBAR SPINE COMPLETE 4+V
5 series · 5 of 5 positions shown · non-contrast
Comparison: CT 02/15/2008.

CLINICAL DATA: Low back pain after falling down steps today.
Initial encounter.

EXAM:
LUMBAR SPINE - COMPLETE 4+ VIEW

[t lumbar spine ap]
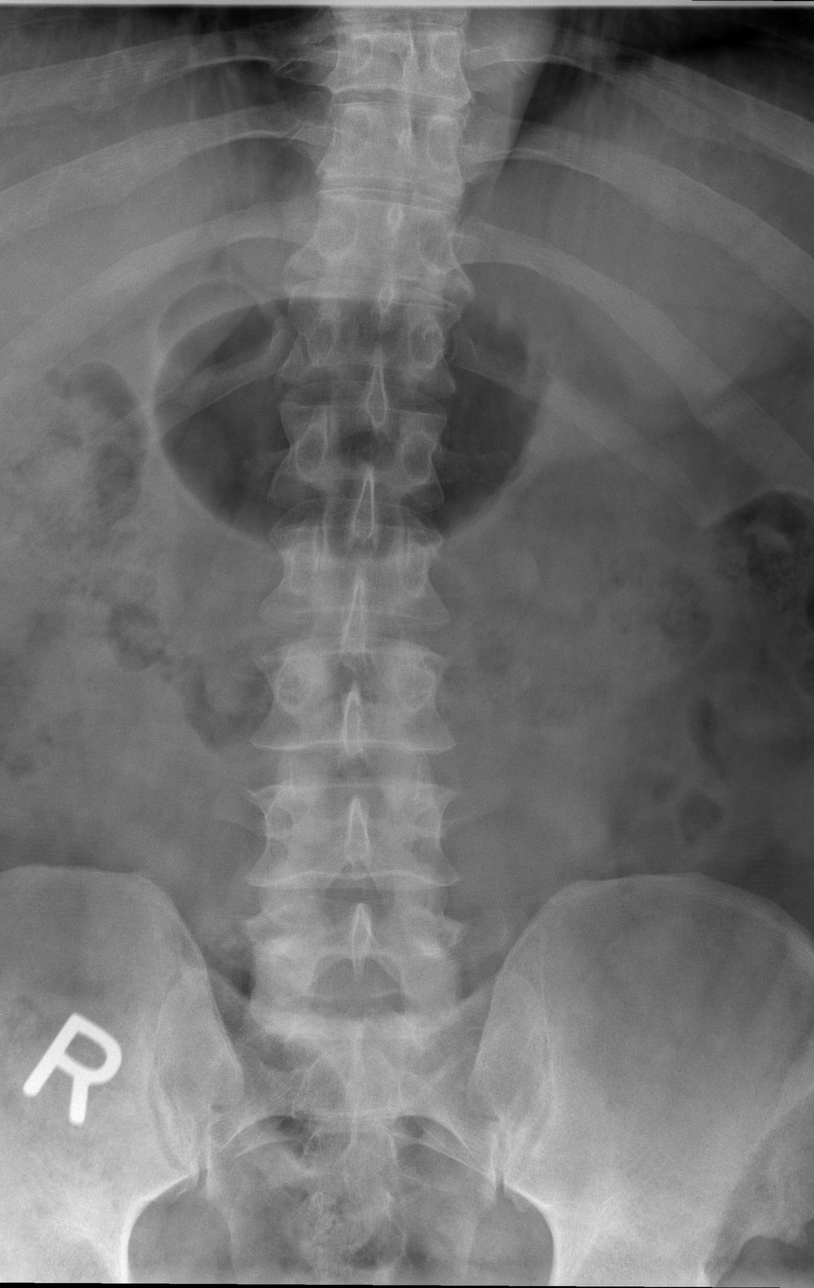

[t lumbar spine obl (1 of 2)]
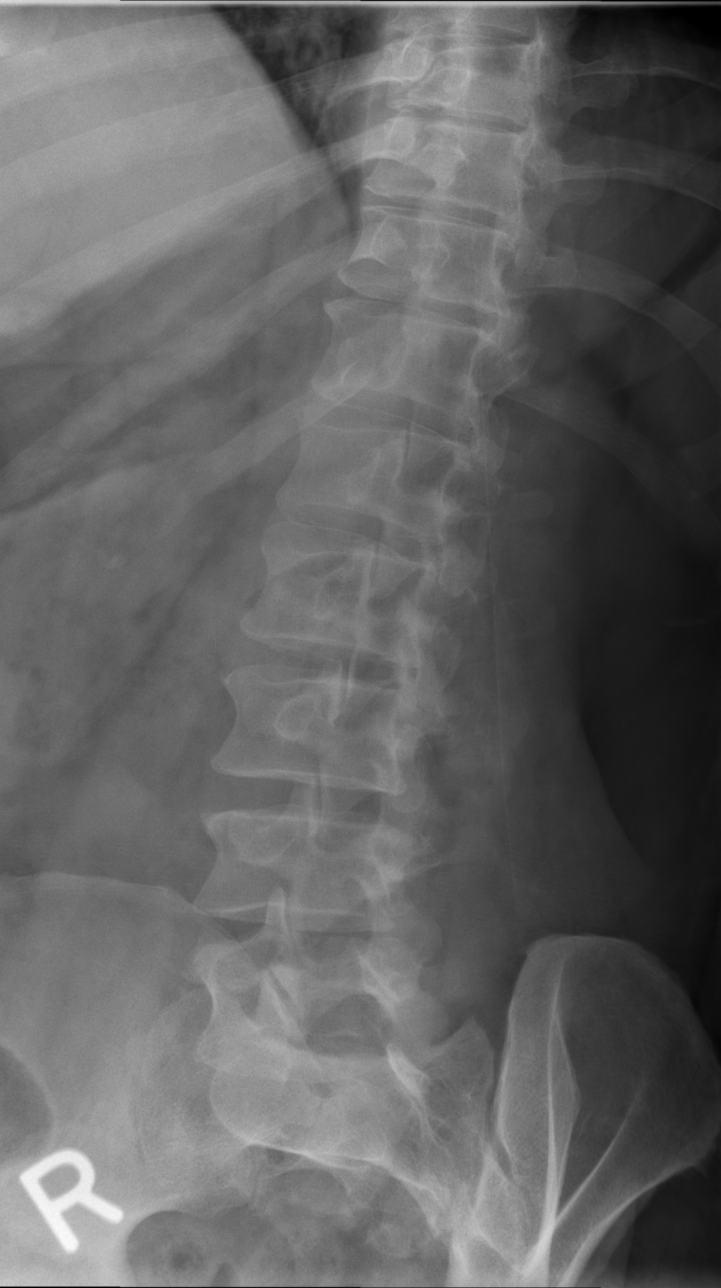

[t lumbar spine obl (2 of 2)]
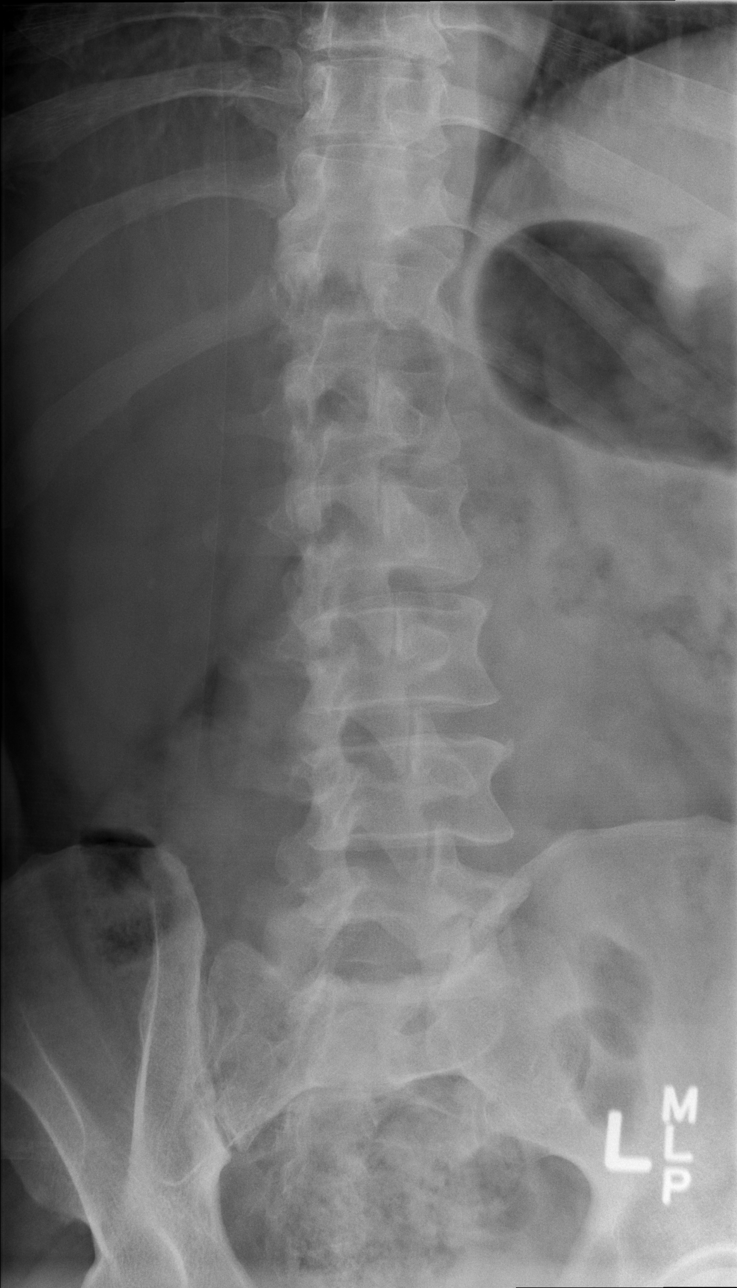

[t lumbar spine lat]
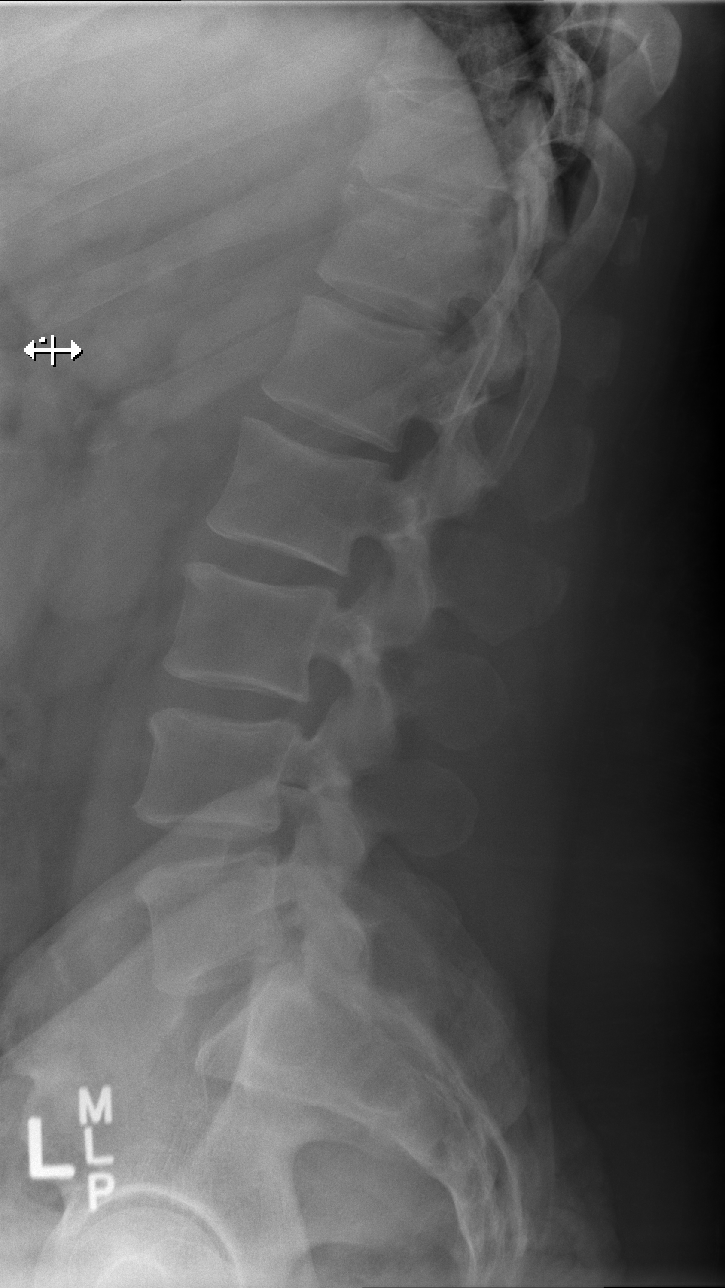

[t lumbar l-5 s-1 spot]
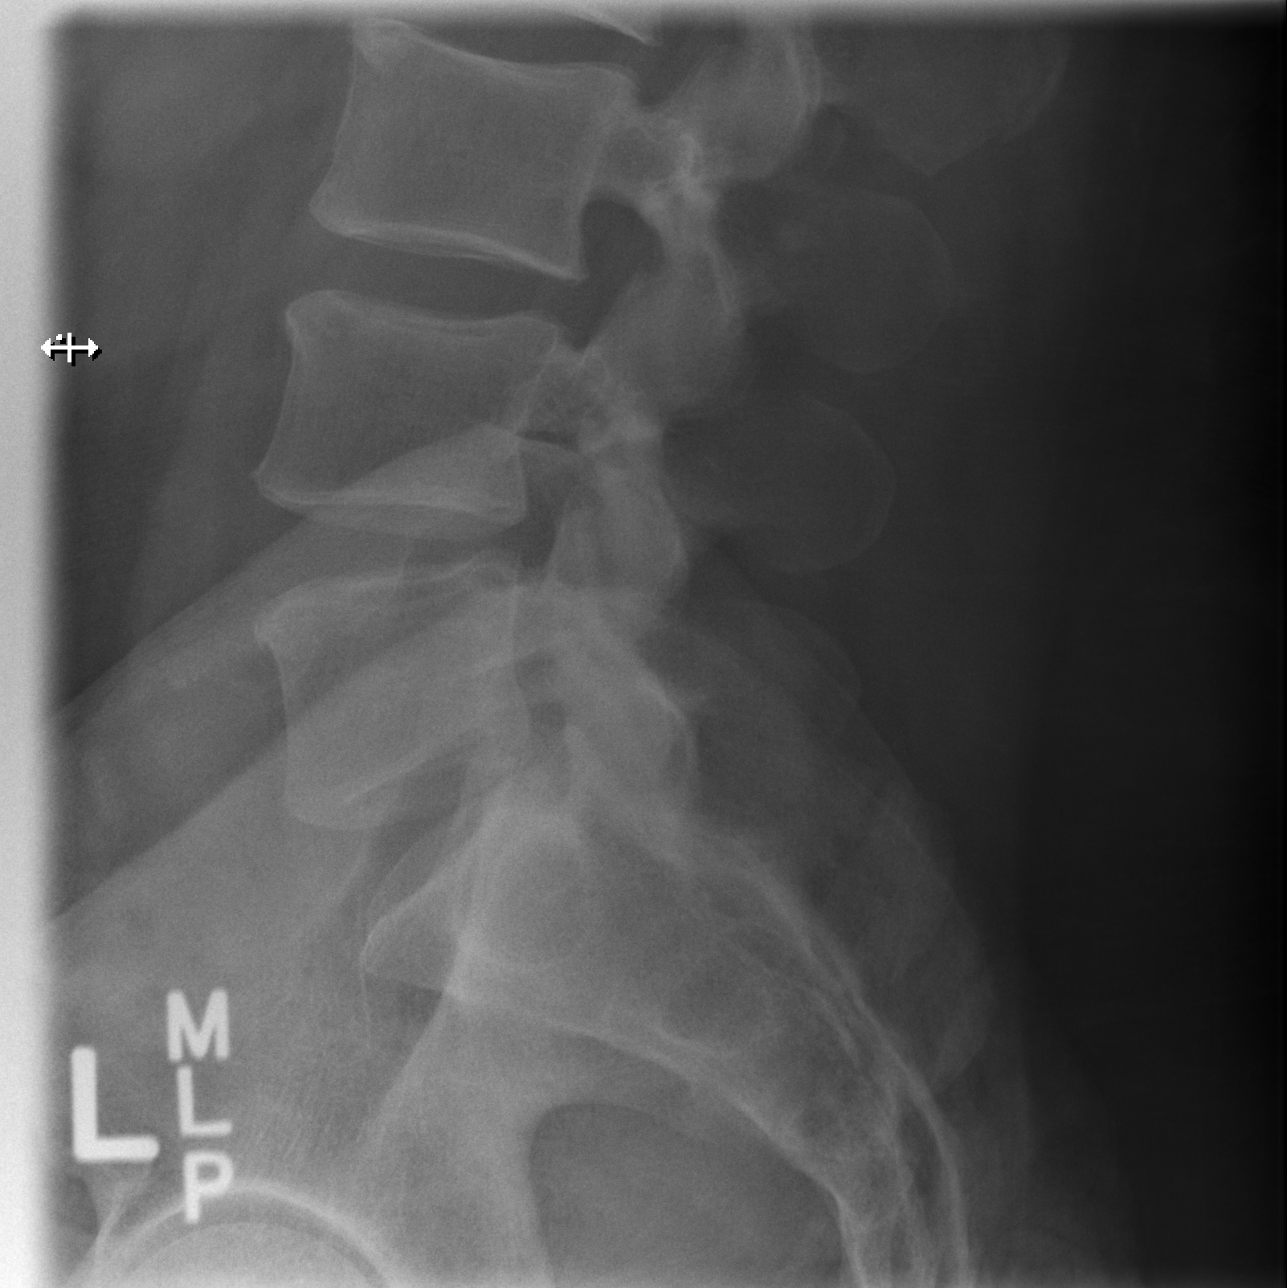

[5 of 5 positions shown; findings below may reference images not displayed]

FINDINGS: There are 5 lumbar type vertebral bodies. The alignment is normal.
There is no evidence of vertebral body fracture or traumatic
subluxation. There is suspicion of a left L1 transverse process
fracture. The left L2 transverse process may be fractured as well.
The disc spaces are preserved. There is mild intervertebral
spurring.
IMPRESSION: Suspected left transverse process fractures at L1 and L2. No
evidence of vertebral body fracture or subluxation.

## 2018-07-09 ENCOUNTER — Other Ambulatory Visit: Payer: Self-pay

## 2018-07-09 ENCOUNTER — Ambulatory Visit (INDEPENDENT_AMBULATORY_CARE_PROVIDER_SITE_OTHER): Payer: BLUE CROSS/BLUE SHIELD | Admitting: Family Medicine

## 2018-07-09 DIAGNOSIS — M5442 Lumbago with sciatica, left side: Secondary | ICD-10-CM

## 2018-07-09 MED ORDER — CYCLOBENZAPRINE HCL 10 MG PO TABS: ORAL_TABLET | ORAL | 0 refills | Status: DC

## 2018-07-09 MED ORDER — PREDNISONE 10 MG PO TABS: ORAL_TABLET | ORAL | 0 refills | Status: DC

## 2018-07-09 NOTE — Progress Notes (Signed)
Patient ID: Antonio Watts, male   DOB: February 12, 1961, 58 y.o.   MRN: [id number]  Virtual Visit via Video Note  I connected with Antonio Watts on 07/09/18 at  9:00 AM EDT by a video enabled telemedicine application and verified that I am speaking with the correct person using two identifiers.  Location patient: home Location provider:work or home office Persons participating in the virtual visit: patient, provider  I discussed the limitations of evaluation and management by telemedicine and the availability of in person appointments. The patient expressed understanding and agreed to proceed.   HPI: Patient called with onset several days ago of right and left-sided low back pain.  Had similar pain in the past.  More severe on the left side.  He is also describing some burning type sensation going down the left anterior thigh with some associated numbness.  No weakness.  No urine or stool incontinence.  No fevers or chills.  No rashes.  He is tried some topical massage and heat without relief.  Leftover Flexeril helped slightly.  Requesting refill.  No recent injury   ROS: See pertinent positives and negatives per HPI.  Past Medical History:  Diagnosis Date  . Hypertension   . Kidney calculi   . Kidney stones   . Low testosterone   . OSA (obstructive sleep apnea)     No past surgical history on file.  Family History  Problem Relation Age of Onset  . Cancer Other        fhx  . Heart disease Other        fhx  . Heart failure Other   . Pulmonary fibrosis Mother     SOCIAL HX: Non-smoker   Current Outpatient Medications:  .  b complex vitamins tablet, Take 1 tablet by mouth daily., Disp: , Rfl:  .  cyclobenzaprine (FLEXERIL) 10 MG tablet, TAKE 1 TABLET NIGHTLY AS NEEDED FOR MUSCLE SPASM, Disp: 20 tablet, Rfl: 0 .  HYDROcodone-homatropine (HYDROMET) 5-1.5 MG/5ML syrup, Take 5 mLs by mouth every 4 (four) hours as needed., Disp: 240 mL, Rfl: 0 .  ibuprofen (ADVIL,MOTRIN) 200 MG  tablet, Take 600 mg by mouth daily as needed for moderate pain., Disp: , Rfl:  .  lisinopril (PRINIVIL,ZESTRIL) 10 MG tablet, Take 1 tablet (10 mg total) by mouth daily., Disp: 90 tablet, Rfl: 3 .  loratadine (CLARITIN) 10 MG tablet, Take 10 mg by mouth daily., Disp: , Rfl:  .  Multiple Vitamin (MULTIVITAMIN WITH MINERALS) TABS tablet, Take 1 tablet by mouth daily., Disp: , Rfl:  .  naproxen (NAPROSYN) 500 MG tablet, Every 12 hours with meals as needed for pain, Disp: 20 tablet, Rfl: 0 .  NON FORMULARY, CPAP at bedtime , Disp: , Rfl:  .  predniSONE (DELTASONE) 10 MG tablet, Taper as follows: 4-4-4-3-3-2-2-1-1, Disp: 24 tablet, Rfl: 0 .  triamcinolone cream (KENALOG) 0.1 %, Apply 1 application topically 2 (two) times daily as needed., Disp: 30 g, Rfl: 1  EXAM:  VITALS per patient if applicable:  GENERAL: alert, oriented, appears well and in no acute distress  HEENT: atraumatic, conjunttiva clear, no obvious abnormalities on inspection of external nose and ears  NECK: normal movements of the head and neck  LUNGS: on inspection no signs of respiratory distress, breathing rate appears normal, no obvious gross SOB, gasping or wheezing  CV: no obvious cyanosis  MS: moves all visible extremities without noticeable abnormality  PSYCH/NEURO: pleasant and cooperative, no obvious depression or anxiety, speech and thought processing grossly  intact  ASSESSMENT AND PLAN:  Discussed the following assessment and plan:  Low back pain with possible sciatica type symptoms on the left  -Refill Flexeril for as needed use.  He is aware of potential sedation -Prednisone taper starting at 40 mg daily.  Reviewed potential side effects -Follow-up promptly for any weakness, progressive pain, or other concerns -Touch base by next week if not improving.  Might consider low-dose gabapentin especially at night if pain worsens or persists     I discussed the assessment and treatment plan with the patient.  The patient was provided an opportunity to ask questions and all were answered. The patient agreed with the plan and demonstrated an understanding of the instructions.   The patient was advised to call back or seek an in-person evaluation if the symptoms worsen or if the condition fails to improve as anticipated.   Evelena PeatBruce Ginamarie Banfield, MD

## 2018-08-19 DIAGNOSIS — G4733 Obstructive sleep apnea (adult) (pediatric): Secondary | ICD-10-CM | POA: Diagnosis not present

## 2018-10-22 ENCOUNTER — Ambulatory Visit: Payer: BC Managed Care – PPO | Admitting: Podiatry

## 2018-10-27 ENCOUNTER — Ambulatory Visit: Payer: BC Managed Care – PPO | Admitting: Podiatry

## 2018-10-27 ENCOUNTER — Other Ambulatory Visit: Payer: Self-pay

## 2018-10-27 ENCOUNTER — Ambulatory Visit (INDEPENDENT_AMBULATORY_CARE_PROVIDER_SITE_OTHER): Payer: BC Managed Care – PPO

## 2018-10-27 ENCOUNTER — Encounter: Payer: Self-pay | Admitting: Podiatry

## 2018-10-27 VITALS — BP 164/79 | HR 68 | Temp 97.4°F | Resp 16

## 2018-10-27 DIAGNOSIS — M722 Plantar fascial fibromatosis: Secondary | ICD-10-CM

## 2018-10-27 MED ORDER — MELOXICAM 15 MG PO TABS: 15.0000 mg | ORAL_TABLET | Freq: Every day | ORAL | 3 refills | Status: DC

## 2018-10-27 MED ORDER — METHYLPREDNISOLONE 4 MG PO TBPK: ORAL_TABLET | ORAL | 0 refills | Status: DC

## 2018-10-27 NOTE — Progress Notes (Signed)
  Subjective:  Patient ID: Antonio Watts, male    DOB: Jun 01, 1960,  MRN: [id number] HPI Chief Complaint  Patient presents with  . Foot Pain    Posterior/medial heel right - aching, numbness x few months, AM pain and now more constant throughout the day, no treatment  . New Patient (Initial Visit)    58 y.o. male presents with the above complaint.   ROS: Denies fever chills nausea vomiting muscle aches pains calf pain back pain chest pain shortness of breath.  Past Medical History:  Diagnosis Date  . Hypertension   . Kidney calculi   . Kidney stones   . Low testosterone   . OSA (obstructive sleep apnea)    No past surgical history on file.  Current Outpatient Medications:  Marland Kitchen  MELATONIN PO, Take by mouth., Disp: , Rfl:  .  b complex vitamins tablet, Take 1 tablet by mouth daily., Disp: , Rfl:  .  lisinopril (PRINIVIL,ZESTRIL) 10 MG tablet, Take 1 tablet (10 mg total) by mouth daily., Disp: 90 tablet, Rfl: 3 .  meloxicam (MOBIC) 15 MG tablet, Take 1 tablet (15 mg total) by mouth daily., Disp: 30 tablet, Rfl: 3 .  methylPREDNISolone (MEDROL DOSEPAK) 4 MG TBPK tablet, 6 day dose pack - take as directed, Disp: 21 tablet, Rfl: 0 .  Multiple Vitamin (MULTIVITAMIN WITH MINERALS) TABS tablet, Take 1 tablet by mouth daily., Disp: , Rfl:  .  NON FORMULARY, CPAP at bedtime , Disp: , Rfl:   Allergies  Allergen Reactions  . Doxycycline Hyclate     REACTION: rash, swelling   Review of Systems Objective:   Vitals:   10/27/18 1331  BP: (!) 164/79  Pulse: 68  Resp: 16  Temp: (!) 97.4 F (36.3 C)    General: Well developed, nourished, in no acute distress, alert and oriented x3   Dermatological: Skin is warm, dry and supple bilateral. Nails x 10 are well maintained; remaining integument appears unremarkable at this time. There are no open sores, no preulcerative lesions, no rash or signs of infection present.  Vascular: Dorsalis Pedis artery and Posterior Tibial artery pedal pulses  are 2/4 bilateral with immedate capillary fill time. Pedal hair growth present. No varicosities and no lower extremity edema present bilateral.   Neruologic: Grossly intact via light touch bilateral. Vibratory intact via tuning fork bilateral. Protective threshold with Semmes Wienstein monofilament intact to all pedal sites bilateral. Patellar and Achilles deep tendon reflexes 2+ bilateral. No Babinski or clonus noted bilateral.   Musculoskeletal: No gross boney pedal deformities bilateral. No pain, crepitus, or limitation noted with foot and ankle range of motion bilateral. Muscular strength 5/5 in all groups tested bilateral.  Gait: Unassisted, Nonantalgic.    Radiographs:  Radiographs demonstrate soft tissue increase in density plantar fashion calcaneal insertion site of the right heel.  Assessment & Plan:   Assessment: Plantar fasciitis right foot.  Plan: Discussed etiology pathology conservative versus surgical therapies.  At this point I performed a injection after sterile Betadine skin prep 20 mg Kenalog 5 mg Marcaine point maximal tenderness of the right heel.  Placed him in a plantar fascial brace to be followed by a night splint at home.  Start him on a Medrol Dosepak to be followed by meloxicam.  Discussed appropriate shoe gear stretching exercises ice therapy and shoe gear modifications we will follow-up with him in approximately 1 month.     Max T. Zumbrota, [place]

## 2018-10-27 NOTE — Patient Instructions (Signed)

## 2018-11-11 ENCOUNTER — Other Ambulatory Visit: Payer: Self-pay

## 2018-11-11 ENCOUNTER — Telehealth (INDEPENDENT_AMBULATORY_CARE_PROVIDER_SITE_OTHER): Payer: Self-pay | Admitting: Family Medicine

## 2018-11-11 DIAGNOSIS — J069 Acute upper respiratory infection, unspecified: Secondary | ICD-10-CM

## 2018-11-11 NOTE — Progress Notes (Signed)
This visit type was conducted due to national recommendations for restrictions regarding the COVID-19 pandemic in an effort to limit this patient's exposure and mitigate transmission in our community.   Virtual Visit via Video Note  I connected with Antonio QuinonesJoseph Watts on 11/11/18 at  5:30 PM EDT by a video enabled telemedicine application and verified that I am speaking with the correct person using two identifiers.  Location patient: home Location provider:work or home office Persons participating in the virtual visit: patient, provider  I discussed the limitations of evaluation and management by telemedicine and the availability of in person appointments. The patient expressed understanding and agreed to proceed.   HPI: Patient called with onset a few days ago of upper respiratory symptoms.  He had some increased fatigue and nasal congestion with yellowish nasal discharge.  He had some cough productive of brown mucus.  No fever.  No dyspnea.  He took some DayQuil and NyQuil and after couple days of rest symptoms have greatly improved.  No loss of taste or smell.  No GI symptoms.  He has been out of work past couple days.  No known sick contacts.  Feels much better today   ROS: See pertinent positives and negatives per HPI.  Past Medical History:  Diagnosis Date  . Hypertension   . Kidney calculi   . Kidney stones   . Low testosterone   . OSA (obstructive sleep apnea)     No past surgical history on file.  Family History  Problem Relation Age of Onset  . Cancer Other        fhx  . Heart disease Other        fhx  . Heart failure Other   . Pulmonary fibrosis Mother     SOCIAL HX: Quit smoking 2013   Current Outpatient Medications:  .  b complex vitamins tablet, Take 1 tablet by mouth daily., Disp: , Rfl:  .  lisinopril (PRINIVIL,ZESTRIL) 10 MG tablet, Take 1 tablet (10 mg total) by mouth daily., Disp: 90 tablet, Rfl: 3 .  MELATONIN PO, Take by mouth., Disp: , Rfl:  .   meloxicam (MOBIC) 15 MG tablet, Take 1 tablet (15 mg total) by mouth daily., Disp: 30 tablet, Rfl: 3 .  methylPREDNISolone (MEDROL DOSEPAK) 4 MG TBPK tablet, 6 day dose pack - take as directed, Disp: 21 tablet, Rfl: 0 .  Multiple Vitamin (MULTIVITAMIN WITH MINERALS) TABS tablet, Take 1 tablet by mouth daily., Disp: , Rfl:  .  NON FORMULARY, CPAP at bedtime , Disp: , Rfl:   EXAM:  VITALS per patient if applicable:  GENERAL: alert, oriented, appears well and in no acute distress  HEENT: atraumatic, conjunttiva clear, no obvious abnormalities on inspection of external nose and ears  NECK: normal movements of the head and neck  LUNGS: on inspection no signs of respiratory distress, breathing rate appears normal, no obvious gross SOB, gasping or wheezing  CV: no obvious cyanosis  MS: moves all visible extremities without noticeable abnormality  PSYCH/NEURO: pleasant and cooperative, no obvious depression or anxiety, speech and thought processing grossly intact  ASSESSMENT AND PLAN:  Discussed the following assessment and plan:  Viral upper respiratory tract infection-patient is in no respiratory distress and much improved today compared with past couple days.  -Recommend observation for now and continued plenty of fluids and rest -We discussed COVID-19 testing and at this point he would like to wait -We have advised that he try to stay quarantined ideally for at least 10  days if he is not tested -Follow-up immediately for any fever, increased shortness of breath, or other concerns.     I discussed the assessment and treatment plan with the patient. The patient was provided an opportunity to ask questions and all were answered. The patient agreed with the plan and demonstrated an understanding of the instructions.   The patient was advised to call back or seek an in-person evaluation if the symptoms worsen or if the condition fails to improve as anticipated.   Carolann Littler, MD

## 2018-11-24 ENCOUNTER — Ambulatory Visit: Payer: BC Managed Care – PPO | Admitting: Podiatry

## 2018-12-03 ENCOUNTER — Encounter: Payer: Self-pay | Admitting: Podiatry

## 2018-12-03 ENCOUNTER — Ambulatory Visit: Payer: BC Managed Care – PPO | Admitting: Podiatry

## 2018-12-03 ENCOUNTER — Other Ambulatory Visit: Payer: Self-pay

## 2018-12-03 DIAGNOSIS — M722 Plantar fascial fibromatosis: Secondary | ICD-10-CM | POA: Diagnosis not present

## 2018-12-05 ENCOUNTER — Encounter: Payer: Self-pay | Admitting: Podiatry

## 2018-12-05 NOTE — Progress Notes (Signed)
He presents today for follow-up of his plantar fasciitis.  He states that is better just the slightest pain now as he refers to his right heel.  States that he has done everything he supposed to however it is almost impossible for him to sleep in the night splint.  He relates that he is approximately 80% improved continues medication and plantar fascial brace  Review of systems: Denies fever chills nausea vomiting muscle aches pains calf pain back pain chest pain shortness of breath.  I have reviewed his past medical history medications allergies surgery social history and review of systems.  No changes were noted.  Objective: Vital signs are stable alert and oriented x3.  Pulses are palpable.  Neurologic sensorium is intact.  Deep tendon reflexes are intact.  Muscle strength is normal symmetrical.  Orthopedic evaluation demonstrates all joints distal to the ankle have a full range of motion without crepitation he has no pain on palpation medial calcaneal tubercle.  Assessment: Resolving plantar fasciitis 80% right foot.  Plan: Discussed etiology pathology conservative versus surgical therapies he will continue with current therapies and will follow-up with me on an as-needed basis.

## 2019-01-23 ENCOUNTER — Other Ambulatory Visit: Payer: Self-pay | Admitting: Family Medicine

## 2019-03-04 ENCOUNTER — Ambulatory Visit: Payer: BC Managed Care – PPO | Admitting: Podiatry

## 2019-03-04 ENCOUNTER — Other Ambulatory Visit: Payer: Self-pay

## 2019-03-04 ENCOUNTER — Encounter: Payer: Self-pay | Admitting: Podiatry

## 2019-03-04 DIAGNOSIS — M722 Plantar fascial fibromatosis: Secondary | ICD-10-CM

## 2019-03-04 NOTE — Progress Notes (Signed)
He presents today with a chief complaint of pain to the right foot states that it seems to be a little better over the past week or so but the past 2 to 3 weeks have been pretty tender last time he was then he was 80% improved now he says he is back down to about 50% improved from his initial state.  He states that seems to wax and wane.  He denies any new trauma denies any new medications.  Objective: Vital signs are stable he is alert and oriented x3 pulses are palpable right foot.  He has tenderness on palpation medial calcaneal tubercle slightly more proximal than previously noted.  He has no pain on direct palpation of the Achilles itself or the posterior tibial tendon itself.  Foot appears to be normal rectus foot type.  Assessment: Chronic intractable plantar fasciitis right.  Plan: At this point I recommended another injection and orthotics.  He Renae Fickle today to be scanned for a set of orthotics and we injected him with 20 mg of Kenalog 5 mg of Marcaine after sterile alcohol prep.  The injection was to the medial aspect of the right heel at the plantar fascial calcaneal insertion site.  I will follow-up with him once those orthotics have arrived.

## 2019-03-26 ENCOUNTER — Other Ambulatory Visit: Payer: Self-pay | Admitting: Podiatry

## 2019-03-29 ENCOUNTER — Other Ambulatory Visit: Payer: BC Managed Care – PPO | Admitting: Orthotics

## 2019-03-29 ENCOUNTER — Other Ambulatory Visit: Payer: Self-pay

## 2019-04-05 ENCOUNTER — Other Ambulatory Visit: Payer: Self-pay

## 2019-04-05 ENCOUNTER — Telehealth (INDEPENDENT_AMBULATORY_CARE_PROVIDER_SITE_OTHER): Payer: Self-pay | Admitting: Family Medicine

## 2019-04-05 ENCOUNTER — Telehealth: Payer: Self-pay | Admitting: Family Medicine

## 2019-04-05 DIAGNOSIS — M545 Low back pain, unspecified: Secondary | ICD-10-CM

## 2019-04-05 MED ORDER — GABAPENTIN 300 MG PO CAPS: 300.0000 mg | ORAL_CAPSULE | Freq: Three times a day (TID) | ORAL | 3 refills | Status: DC

## 2019-04-05 NOTE — Progress Notes (Signed)
This visit type was conducted due to national recommendations for restrictions regarding the COVID-19 pandemic in an effort to limit this patient's exposure and mitigate transmission in our community.   Virtual Visit via Telephone Note  I connected with Antonio Watts on 04/05/19 at 10:45 AM EST by telephone and verified that I am speaking with the correct person using two identifiers.   I discussed the limitations, risks, security and privacy concerns of performing an evaluation and management service by telephone and the availability of in person appointments. I also discussed with the patient that there may be a patient responsible charge related to this service. The patient expressed understanding and agreed to proceed.  Location patient: home Location provider: work or home office Participants present for the call: patient, provider Patient did not have a visit in the prior 7 days to address this/these issue(s).   History of Present Illness: Antonio Watts has a long history of intermittent back pain.  He states current episode been for about 3 days and he describes a "stabbing "pain mid lower lumbar area.  He has sensation of spasms taken Flexeril for that in the past.  He has intermittent burning sensation and numbness left lateral thigh.  No urine or stool incontinence.  No definite weakness.  Pain is 8 out of 10 at its worst.  Not relieved with nonsteroidals.  He has taken prednisone occasionally for flareups in the past.  No recent fevers or chills.  We discussed possible use of gabapentin in the past for similar flareup.  He would like to consider this at this point.  He had a fall few years ago with fractures L1-L2 but states this pain preceded that by several years.  No appetite or weight changes.  No fever.  No dysuria.  Past Medical History:  Diagnosis Date  . Hypertension   . Kidney calculi   . Kidney stones   . Low testosterone   . OSA (obstructive sleep apnea)    No past surgical  history on file.  reports that he quit smoking about 7 years ago. His smoking use included cigarettes. He has a 35.00 pack-year smoking history. He has never used smokeless tobacco. He reports that he does not drink alcohol or use drugs. family history includes Cancer in an other family member; Heart disease in an other family member; Heart failure in an other family member; Pulmonary fibrosis in his mother. Allergies  Allergen Reactions  . Doxycycline Hyclate     REACTION: rash, swelling      Observations/Objective: Patient sounds cheerful and well on the phone. I do not appreciate any SOB. Speech and thought processing are grossly intact. Patient reported vitals:  Assessment and Plan:  Intermittent severe lumbar back pain.  Suspect lumbar nerve root impingement.  He has not had any progressive neurologic deficits  -We discussed trial of gabapentin 300 mg nightly and may titrate up as tolerated to 3 times daily.  He is aware of potential for sedation with this. -Follow-up immediately for any progressive weakness or any urine or stool incontinence or other concerns.  Follow Up Instructions:  -As above   [id number] 5-10 [id number] 11-20 99443 21-30 I did not refer this patient for an OV in the next 24 hours for this/these issue(s).  I discussed the assessment and treatment plan with the patient. The patient was provided an opportunity to ask questions and all were answered. The patient agreed with the plan and demonstrated an understanding of the instructions.   The patient  was advised to call back or seek an in-person evaluation if the symptoms worsen or if the condition fails to improve as anticipated.  I provided 16 minutes of non-face-to-face time during this encounter.   Evelena Peat, MD

## 2019-04-05 NOTE — Telephone Encounter (Signed)
Patient has been scheduled for a Doxy/telephone visit today.

## 2019-04-05 NOTE — Telephone Encounter (Signed)
Patient called and stated that he is having a really bad back pain and would like to talk to Dr. Caryl Never or his CMA about this. Maybe he can call something in to his pharmacy for pain. Please return patients call, thanks.

## 2019-04-16 ENCOUNTER — Ambulatory Visit: Payer: Self-pay | Admitting: Orthotics

## 2019-04-16 ENCOUNTER — Other Ambulatory Visit: Payer: Self-pay

## 2019-04-16 DIAGNOSIS — M722 Plantar fascial fibromatosis: Secondary | ICD-10-CM

## 2019-04-16 NOTE — Progress Notes (Signed)
Patient came in today to p/up functional foot orthotics.   The orthotics were assessed to both fit and function.  The F/O addressed the biomechanical issues/pathologies as intended, offering good longitudinal arch support, proper offloading, and foot support. There weren't any signs of discomfort or irritation.  The F/O fit properly in footwear with minimal trimming/adjustments. 

## 2019-04-25 ENCOUNTER — Other Ambulatory Visit: Payer: Self-pay | Admitting: Family Medicine

## 2019-06-21 ENCOUNTER — Encounter: Payer: Self-pay | Admitting: Family Medicine

## 2019-06-21 ENCOUNTER — Ambulatory Visit: Payer: Self-pay | Admitting: Family Medicine

## 2019-06-21 ENCOUNTER — Ambulatory Visit: Payer: BC Managed Care – PPO | Admitting: Family Medicine

## 2019-06-21 ENCOUNTER — Other Ambulatory Visit: Payer: Self-pay

## 2019-06-21 VITALS — BP 142/86 | HR 74 | Temp 98.5°F | Wt 340.5 lb

## 2019-06-21 DIAGNOSIS — H6981 Other specified disorders of Eustachian tube, right ear: Secondary | ICD-10-CM | POA: Diagnosis not present

## 2019-06-21 NOTE — Progress Notes (Signed)
  Subjective:     Patient ID: Antonio Watts, male   DOB: 12-29-1960, 59 y.o.   MRN: [id number]  HPI   Antonio Watts is seen with "muffled "right ear for the past several days.  No true hearing loss.  No ear pain.  No ear drainage.  He uses earplugs at night to sleep.  No left ear symptoms.  He does have some nasal congestion which he attributes to allergies.  No recent fevers or chills.  No vertigo.  Past Medical History:  Diagnosis Date  . Hypertension   . Kidney calculi   . Kidney stones   . Low testosterone   . OSA (obstructive sleep apnea)    No past surgical history on file.  reports that he quit smoking about 8 years ago. His smoking use included cigarettes. He has a 35.00 pack-year smoking history. He has never used smokeless tobacco. He reports that he does not drink alcohol or use drugs. family history includes Cancer in an other family member; Heart disease in an other family member; Heart failure in an other family member; Pulmonary fibrosis in his mother. Allergies  Allergen Reactions  . Doxycycline Hyclate     REACTION: rash, swelling     Review of Systems  Constitutional: Negative for chills and fever.  HENT: Positive for congestion. Negative for hearing loss and sinus pressure.   Respiratory: Negative for cough and shortness of breath.        Objective:   Physical Exam Vitals reviewed.  Constitutional:      Appearance: Normal appearance.  HENT:     Right Ear: Tympanic membrane, ear canal and external ear normal. There is no impacted cerumen.     Left Ear: Tympanic membrane, ear canal and external ear normal. There is no impacted cerumen.     Ears:     Comments: He has really no cerumen in either canal.  Eardrums are normal in appearance. Cardiovascular:     Rate and Rhythm: Normal rate and regular rhythm.  Neurological:     Mental Status: He is alert.        Assessment:     Vague right ear symptoms.  No evidence for otitis media, serous effusion, or  cerumen.  Question eustachian tube dysfunction    Plan:     -Consider addition of Flonase for his allergic symptoms and continue with over-the-counter antihistamine  -Follow-up immediately for any hearing loss or other changes  Kristian Covey MD [facility] at [facility]

## 2019-06-21 NOTE — Patient Instructions (Signed)
Your ears look good- no wax and no signs of effusion  Consider over the counter Flonase for nasal congestion.

## 2019-06-30 DIAGNOSIS — G4733 Obstructive sleep apnea (adult) (pediatric): Secondary | ICD-10-CM | POA: Diagnosis not present

## 2019-07-22 ENCOUNTER — Other Ambulatory Visit: Payer: Self-pay | Admitting: Family Medicine

## 2019-07-29 ENCOUNTER — Encounter: Payer: Self-pay | Admitting: Emergency Medicine

## 2019-07-29 ENCOUNTER — Other Ambulatory Visit: Payer: Self-pay

## 2019-07-29 ENCOUNTER — Emergency Department: Payer: BC Managed Care – PPO

## 2019-07-29 ENCOUNTER — Emergency Department
Admission: EM | Admit: 2019-07-29 | Discharge: 2019-07-29 | Disposition: A | Discharge status: Home / Self Care | Payer: BC Managed Care – PPO | Attending: Emergency Medicine | Admitting: Emergency Medicine

## 2019-07-29 DIAGNOSIS — I10 Essential (primary) hypertension: Secondary | ICD-10-CM | POA: Insufficient documentation

## 2019-07-29 DIAGNOSIS — Z79899 Other long term (current) drug therapy: Secondary | ICD-10-CM | POA: Diagnosis not present

## 2019-07-29 DIAGNOSIS — R109 Unspecified abdominal pain: Secondary | ICD-10-CM | POA: Diagnosis not present

## 2019-07-29 DIAGNOSIS — Z87891 Personal history of nicotine dependence: Secondary | ICD-10-CM | POA: Insufficient documentation

## 2019-07-29 DIAGNOSIS — N200 Calculus of kidney: Secondary | ICD-10-CM | POA: Insufficient documentation

## 2019-07-29 DIAGNOSIS — N132 Hydronephrosis with renal and ureteral calculous obstruction: Secondary | ICD-10-CM | POA: Diagnosis not present

## 2019-07-29 LAB — COMPREHENSIVE METABOLIC PANEL
ALT: 29 U/L (ref 0–44)
AST: 27 U/L (ref 15–41)
Albumin: 4.2 g/dL (ref 3.5–5.0)
Alkaline Phosphatase: 65 U/L (ref 38–126)
Anion gap: 7 (ref 5–15)
BUN: 17 mg/dL (ref 6–20)
CO2: 24 mmol/L (ref 22–32)
Calcium: 9.7 mg/dL (ref 8.9–10.3)
Chloride: 103 mmol/L (ref 98–111)
Creatinine, Ser: 1.5 mg/dL — ABNORMAL HIGH (ref 0.61–1.24)
GFR calc Af Amer: 58 mL/min — ABNORMAL LOW (ref >60)
GFR calc non Af Amer: 50 mL/min — ABNORMAL LOW (ref >60)
Glucose, Bld: 132 mg/dL — ABNORMAL HIGH (ref 70–99)
Potassium: 3.8 mmol/L (ref 3.5–5.1)
Sodium: 134 mmol/L — ABNORMAL LOW (ref 135–145)
Total Bilirubin: 1.1 mg/dL (ref 0.3–1.2)
Total Protein: 8 g/dL (ref 6.5–8.1)

## 2019-07-29 LAB — URINALYSIS, ROUTINE W REFLEX MICROSCOPIC
Bacteria, UA: NONE SEEN
Bilirubin Urine: NEGATIVE
Glucose, UA: NEGATIVE mg/dL
Ketones, ur: NEGATIVE mg/dL
Leukocytes,Ua: NEGATIVE
Nitrite: NEGATIVE
Protein, ur: 30 mg/dL — AB
Specific Gravity, Urine: 1.027 (ref 1.005–1.030)
pH: 5 (ref 5.0–8.0)

## 2019-07-29 LAB — CBC WITH DIFFERENTIAL/PLATELET
Abs Immature Granulocytes: 0.05 10*3/uL (ref 0.00–0.07)
Basophils Absolute: 0 10*3/uL (ref 0.0–0.1)
Basophils Relative: 0 %
Eosinophils Absolute: 0 10*3/uL (ref 0.0–0.5)
Eosinophils Relative: 0 %
HCT: 47.8 % (ref 39.0–52.0)
Hemoglobin: 15.8 g/dL (ref 13.0–17.0)
Immature Granulocytes: 0 %
Lymphocytes Relative: 12 %
Lymphs Abs: 1.8 10*3/uL (ref 0.7–4.0)
MCH: 24.2 pg — ABNORMAL LOW (ref 26.0–34.0)
MCHC: 33.1 g/dL (ref 30.0–36.0)
MCV: 73.3 fL — ABNORMAL LOW (ref 80.0–100.0)
Monocytes Absolute: 1.2 10*3/uL — ABNORMAL HIGH (ref 0.1–1.0)
Monocytes Relative: 8 %
Neutro Abs: 11.5 10*3/uL — ABNORMAL HIGH (ref 1.7–7.7)
Neutrophils Relative %: 80 %
Platelets: 232 10*3/uL (ref 150–400)
RBC: 6.52 MIL/uL — ABNORMAL HIGH (ref 4.22–5.81)
RDW: 17.2 % — ABNORMAL HIGH (ref 11.5–15.5)
WBC: 14.5 10*3/uL — ABNORMAL HIGH (ref 4.0–10.5)
nRBC: 0.1 % (ref 0.0–0.2)

## 2019-07-29 MED ORDER — ONDANSETRON 4 MG PO TBDP
ORAL_TABLET | ORAL | 0 refills | Status: DC
Start: 2019-07-29 — End: 2020-11-21

## 2019-07-29 MED ORDER — MORPHINE SULFATE (PF) 4 MG/ML IV SOLN
4.0000 mg | Freq: Once | INTRAVENOUS | Status: AC
  Administered 2019-07-29: 05:00:00 4 mg via INTRAVENOUS
  Filled 2019-07-29: qty 1

## 2019-07-29 MED ORDER — ONDANSETRON HCL 4 MG/2ML IJ SOLN
4.0000 mg | INTRAMUSCULAR | Status: AC
  Administered 2019-07-29: 4 mg via INTRAVENOUS
  Filled 2019-07-29: qty 2

## 2019-07-29 MED ORDER — SODIUM CHLORIDE 0.9 % IV SOLN
150.0000 mg | Freq: Once | INTRAVENOUS | Status: AC
  Administered 2019-07-29: 08:00:00 150 mg via INTRAVENOUS
  Filled 2019-07-29: qty 7.5

## 2019-07-29 MED ORDER — OXYCODONE-ACETAMINOPHEN 7.5-325 MG PO TABS: 1.0000 | ORAL_TABLET | Freq: Four times a day (QID) | ORAL | 0 refills | Status: DC | PRN

## 2019-07-29 MED ORDER — SODIUM CHLORIDE 0.9 % IV BOLUS
1000.0000 mL | Freq: Once | INTRAVENOUS | Status: AC
  Administered 2019-07-29: 08:00:00 1000 mL via INTRAVENOUS

## 2019-07-29 MED ORDER — KETOROLAC TROMETHAMINE 30 MG/ML IJ SOLN
15.0000 mg | Freq: Once | INTRAMUSCULAR | Status: AC
  Administered 2019-07-29: 06:00:00 15 mg via INTRAVENOUS
  Filled 2019-07-29: qty 1

## 2019-07-29 NOTE — Discharge Instructions (Addendum)
You have been seen in the Emergency Department (ED) today for pain caused by kidney stones.  As we have discussed, please drink plenty of fluids.  Please make a follow up appointment with the physician(s) listed elsewhere in this documentation.  You may take pain medication as needed but ONLY as prescribed.  Please also take your prescribed Flomax daily.  We also recommend that you take over-the-counter ibuprofen regularly according to label instructions over the next 5 days.  Take it with meals to minimize stomach discomfort.  Please see your doctor as soon as possible as stones may take 1-3 weeks to pass and you may require additional care or medications.  Do not drink alcohol, drive or participate in any other potentially dangerous activities while taking opiate pain medication as it may make you sleepy. Do not take this medication with any other sedating medications, either prescription or over-the-counter. If you were prescribed Percocet or Vicodin, do not take these with acetaminophen (Tylenol) as it is already contained within these medications.   Take Percocet as needed for severe pain.  This medication is an opiate (or narcotic) pain medication and can be habit forming.  Use it as little as possible to achieve adequate pain control.  Do not use or use it with extreme caution if you have a history of opiate abuse or dependence.  If you are on a pain contract with your primary care doctor or a pain specialist, be sure to let them know you were prescribed this medication today from the [facility].  This medication is intended for your use only - do not give any to anyone else and keep it in a secure place where nobody else, especially children, have access to it.  It will also cause or worsen constipation, so you may want to consider taking an over-the-counter stool softener while you are taking this medication.  Return to the Emergency Department (ED) or call your doctor  if you have any worsening pain, fever, painful urination, are unable to urinate, or develop other symptoms that concern you. 

## 2019-07-29 NOTE — ED Provider Notes (Signed)
[facility] Emergency Department Provider Note  ____________________________________________   First MD Initiated Contact with Patient 07/29/19 [phone]     (approximate)  I have reviewed the triage vital signs and the nursing notes.   HISTORY  Chief Complaint Flank Pain    HPI Antonio Watts is a 59 y.o. male with medical history as listed below who presents per private vehicle for evaluation of acute onset and severe intermittent pain in his left flank for the last 2 days.  It feels similar to prior episodes of kidney stones.  He noticed some blood in his urine initially which has since resolved but he has had increased urinary frequency.  The pain comes in waves and he is currently having severe pain.  He has had nausea but no vomiting.  He denies fever/chills, sore throat, chest pain, shortness of breath, nausea, and vomiting.  He is having some of the pain radiating from the left flank into the left side of his abdomen.  No testicular pain.  Nothing in particular makes the pain better or worse.         Past Medical History:  Diagnosis Date  . Hypertension   . Kidney calculi   . Kidney stones   . Low testosterone   . OSA (obstructive sleep apnea)     Patient Active Problem List   Diagnosis Date Noted  . Severe obesity (BMI >= 40) ([place]) 08/19/2013  . Hypertension 03/30/2012  . Low testosterone 01/29/2012  . OSA (obstructive sleep apnea) 01/15/2012  . Cellulitis and abscess 09/29/2010  . NEPHROLITHIASIS 01/23/2009  . KERATOSIS 01/23/2009  . PERS HX TOBACCO USE PRESENTING HAZARDS HEALTH 01/23/2009    History reviewed. No pertinent surgical history.  Prior to Admission medications   Medication Sig Start Date End Date Taking? Authorizing Provider  b complex vitamins tablet Take 1 tablet by mouth daily.    [provider]  gabapentin (NEURONTIN) 300 MG capsule Take 1 capsule (300 mg total) by mouth 3 (three) times daily. 04/05/19    Burchette, Alinda Sierras, MD  lisinopril (ZESTRIL) 10 MG tablet TAKE 1 TABLET(10 MG) BY MOUTH DAILY 07/23/19   Burchette, Alinda Sierras, MD  MELATONIN PO Take by mouth.    [provider]  meloxicam (MOBIC) 15 MG tablet TAKE 1 TABLET(15 MG) BY MOUTH DAILY 03/29/19   Hyatt, Max T, DPM  Multiple Vitamin (MULTIVITAMIN WITH MINERALS) TABS tablet Take 1 tablet by mouth daily.    [provider]  NON FORMULARY CPAP at bedtime     [provider]  ondansetron (ZOFRAN ODT) 4 MG disintegrating tablet Allow 1-2 tablets to dissolve in your mouth every 8 hours as needed for nausea/vomiting 07/29/19   Hinda Kehr, MD  oxyCODONE-acetaminophen (PERCOCET) 7.5-325 MG tablet Take 1 tablet by mouth every 6 (six) hours as needed for severe pain. 07/29/19   Hinda Kehr, MD    Allergies Doxycycline hyclate  Family History  Problem Relation Age of Onset  . Cancer Other        fhx  . Heart disease Other        fhx  . Heart failure Other   . Pulmonary fibrosis Mother     Social History Social History   Tobacco Use  . Smoking status: Former Smoker    Packs/day: 1.00    Years: 35.00    Pack years: 35.00    Types: Cigarettes    Quit date: 05/26/2011    Years since quitting: 8.1  .  Smokeless tobacco: Never Used  Substance Use Topics  . Alcohol use: No  . Drug use: No    Review of Systems Constitutional: No fever/chills Eyes: No visual changes. ENT: No sore throat. Cardiovascular: Denies chest pain. Respiratory: Denies shortness of breath. Gastrointestinal: Left-sided abdominal pain that seems to be radiating from the left flank.  Nausea, no vomiting. Genitourinary: Increased urinary frequency, brief episode of hematuria. Musculoskeletal: Left flank pain. Integumentary: Negative for rash. Neurological: Negative for headaches, focal weakness or numbness.   ____________________________________________   PHYSICAL EXAM:  VITAL SIGNS: ED Triage Vitals  Enc Vitals Group     BP  07/29/19 0445 (!) 178/88     Pulse Rate 07/29/19 0445 66     Resp 07/29/19 0445 18     Temp 07/29/19 0445 98.2 F (36.8 C)     Temp Source 07/29/19 0445 Oral     SpO2 07/29/19 0445 94 %     Weight 07/29/19 0437 (!) 145.2 kg (320 lb)     Height 07/29/19 0437 1.753 m (5\' 9" )     Head Circumference --      Peak Flow --      Pain Score 07/29/19 0437 9     Pain Loc --      Pain Edu? --      Excl. in GC? --     Constitutional: Alert and oriented.  Patient appears severely uncomfortable, he is pacing around the exam room and holding his hand on his left flank. Eyes: Conjunctivae are normal.  Head: Atraumatic. Nose: No congestion/rhinnorhea. Mouth/Throat: Patient is wearing a mask. Neck: No stridor.  No meningeal signs.   Cardiovascular: Normal rate, regular rhythm. Good peripheral circulation. Grossly normal heart sounds. Respiratory: Normal respiratory effort.  No retractions. Gastrointestinal: Soft and nontender. No distention.  Musculoskeletal: Severe left CVA tenderness to percussion.  No lower extremity tenderness nor edema. No gross deformities of extremities. Neurologic:  Normal speech and language. No gross focal neurologic deficits are appreciated.  Skin:  Skin is warm, dry and intact. Psychiatric: Mood and affect are normal. Speech and behavior are normal.  ____________________________________________   LABS (all labs ordered are listed, but only abnormal results are displayed)  Labs Reviewed  CBC WITH DIFFERENTIAL/PLATELET - Abnormal; Notable for the following components:      Result Value   WBC 14.5 (*)    RBC 6.52 (*)    MCV 73.3 (*)    MCH 24.2 (*)    RDW 17.2 (*)    Neutro Abs 11.5 (*)    Monocytes Absolute 1.2 (*)    All other components within normal limits  COMPREHENSIVE METABOLIC PANEL - Abnormal; Notable for the following components:   Sodium 134 (*)    Glucose, Bld 132 (*)    Creatinine, Ser 1.50 (*)    GFR calc non Af Amer 50 (*)    GFR calc Af Amer  58 (*)    All other components within normal limits  URINALYSIS, ROUTINE W REFLEX MICROSCOPIC   ____________________________________________  EKG  No indication for emergent EKG ____________________________________________  RADIOLOGY 09/28/19, personally viewed and evaluated these images (plain radiographs) as part of my medical decision making, as well as reviewing the written report by the radiologist.  ED MD interpretation:  2-mm left ureteral stone just proximal to UVJ with hydronephrosis.  Official radiology report(s): CT Renal Stone Study  Result Date: 07/29/2019 CLINICAL DATA:  Flank pain, stone disease suspected EXAM: CT ABDOMEN AND PELVIS WITHOUT CONTRAST TECHNIQUE:  Multidetector CT imaging of the abdomen and pelvis was performed following the standard protocol without IV contrast. COMPARISON:  CT 08/20/2016 FINDINGS: Lower chest: Atelectatic changes in the lung bases. Normal heart size. No pericardial effusion. Hepatobiliary: No worrisome focal liver lesions. Smooth liver surface contour. Normal hepatic attenuation. Gallbladder and biliary tree are unremarkable. No calcified gallstones. Pancreas: Partial fatty replacement of the pancreas. No ductal dilatation or inflammation. Spleen: Normal in size without focal abnormality. Adrenals/Urinary Tract: Normal adrenal glands. Asymmetrically increased left perinephric and periureteral stranding with moderate hydroureteronephrosis to the level of a 2 mm calculus just proximal to the left ureterovesicular junction (2/84). Additional punctate nonobstructing calculus in the interpolar left kidney. No right urolithiasis or hydronephrosis. No visible or contour deforming renal lesions. Urinary bladder is largely decompressed at the time of exam and therefore poorly evaluated by CT imaging. Grossly unremarkable. Stomach/Bowel: Distal esophagus, stomach and duodenal sweep are unremarkable. No small bowel wall thickening or dilatation. No  evidence of obstruction. A normal appendix is visualized. No colonic dilatation or wall thickening. Vascular/Lymphatic: Minimal plaque in the aorta and branch vessels. Luminal evaluation precluded in the absence of contrast. No suspicious or enlarged lymph nodes in the included lymphatic chains. Reproductive: The prostate and seminal vesicles are unremarkable. Other: Small fat containing umbilical hernia with a 2 cm hernia neck. No bowel containing hernia. No abdominopelvic free air or fluid. Left retroperitoneal stranding, as above. Musculoskeletal: No acute osseous abnormality or suspicious osseous lesion. Mild degenerative changes in the spine, hips and pelvis. IMPRESSION: 1. Moderate left hydroureteronephrosis to the level of a 2 mm calculus just proximal to the left ureterovesicular junction. 2. Additional punctate nonobstructing calculus in the interpolar left kidney. Electronically Signed   By: Kreg Shropshire M.D.   On: 07/29/2019 06:44    ____________________________________________   PROCEDURES   Procedure(s) performed (including Critical Care):  .1-3 Lead EKG Interpretation Performed by: Loleta Rose, MD Authorized by: Loleta Rose, MD     Interpretation: normal     ECG rate:  60   ECG rate assessment: normal     Rhythm: sinus rhythm     Ectopy: none     Conduction: normal       ____________________________________________   INITIAL IMPRESSION / MDM / ASSESSMENT AND PLAN / ED COURSE  As part of my medical decision making, I reviewed the following data within the electronic MEDICAL RECORD NUMBER Nursing notes reviewed and incorporated, Labs reviewed , Old chart reviewed, Patient signed out to Dr. Cyril Loosen and Notes from prior ED visits   Differential diagnosis includes, but is not limited to, ureteral/renal colic, UTI/pyelonephritis, renal infarction, diverticulitis, less likely acute aortic syndrome.  Strongly suspect ureteral colic with obstructive stone.  Given that it is  Thursday morning I will hold off on giving an NSAID in case he might benefit from lithotripsy and in case it could be quickly arranged with urology.  I am giving morphine 4 mg IV and Zofran 4 mg IV.  Basic lab work is pending and I have ordered a CT renal stone protocol.  Patient understands and agrees with the plan.     Clinical Course as of Jul 29 703  Thu Jul 29, 2019  4270 The patient was not getting much relief after morphine 4 mg IV.  I verified with the patient that he has been taking Advil, so he is already not eligible for lithotripsy today even if it was appropriate for him.  I have ordered Toradol 15 mg IV.   [  CF]  0557 Mild leukocytosis, likely reactive in the setting of acute pain but could represent infection.  WBC(!): 14.5 [CF]  0635 Normal CMP except for Cr 1.5.  May represent volume depletion.  Ordering NS 1 L NS IV bolus.  Comprehensive metabolic panel(!) [CF]  [id number] Patient has a 2 mm obstructive stone just proximal to the left UVJ.  The patient is still in a moderate amount of pain.  I have ordered lidocaine 1.5 mg/kg (with a ceiling dose of 200 mg) IV to help with the renal colic.  He agrees with this plan.  As per protocol, patient will be on the cardiac monitor to evaluate for cardiac arrhythmia from the lidocaine.  I told him it is important to provide a urine specimen so we can evaluate for possible infection.  The current plan is for him to be discharged with close outpatient follow-up if his urinalysis is reassuring with no sign of infection.  If he does appear to have a UTI, urology should be consulted given the obstructive and infected stone.  He agrees with the plan and I have discussed the case in person with Dr. Cyril Loosen who is assuming ED care.  CT Renal [facility] [place] [CF]    Clinical Course User Index [CF] Loleta Rose, MD     ____________________________________________  FINAL CLINICAL IMPRESSION(S) / ED DIAGNOSES  Final diagnoses:  Kidney stone      MEDICATIONS GIVEN DURING THIS VISIT:  Medications  sodium chloride 0.9 % bolus 1,000 mL (has no administration in time range)  lidocaine (XYLOCAINE) 150 mg in sodium chloride 0.9 % 100 mL IVPB (has no administration in time range)  morphine 4 MG/ML injection 4 mg (4 mg Intravenous Given 07/29/19 0508)  ondansetron (ZOFRAN) injection 4 mg (4 mg Intravenous Given 07/29/19 0508)  ketorolac (TORADOL) 30 MG/ML injection 15 mg (15 mg Intravenous Given 07/29/19 0552)     ED Discharge Orders         Ordered    oxyCODONE-acetaminophen (PERCOCET) 7.5-325 MG tablet  Every 6 hours PRN     07/29/19 0646    ondansetron (ZOFRAN ODT) 4 MG disintegrating tablet     07/29/19 1761          *Please note:  MARIUS BETTS was evaluated in Emergency Department on 07/29/2019 for the symptoms described in the history of present illness. He was evaluated in the context of the global COVID-19 pandemic, which necessitated consideration that the patient might be at risk for infection with the SARS-CoV-2 virus that causes COVID-19. Institutional protocols and algorithms that pertain to the evaluation of patients at risk for COVID-19 are in a state of rapid change based on information released by regulatory bodies including the CDC and federal and state organizations. These policies and algorithms were followed during the patient's care in the ED.  Some ED evaluations and interventions may be delayed as a result of limited staffing during the pandemic.*  Note:  This document was prepared using Dragon voice recognition software and may include unintentional dictation errors.   Loleta Rose, MD 07/29/19 [phone]

## 2019-07-29 NOTE — ED Provider Notes (Signed)
Urinalysis without evidence of infection, appropriate for discharge   Jene Every, MD 07/29/19 [phone]

## 2019-07-29 NOTE — ED Triage Notes (Signed)
Patient ambulatory to triage with steady gait, without difficulty or distress noted, mask in place; pt appears uncomfortable; reports left flank pain radiating into abd x 2 days accomp by nausea; st hx kidney stones

## 2019-08-05 ENCOUNTER — Other Ambulatory Visit: Payer: Self-pay | Admitting: Family Medicine

## 2019-08-05 ENCOUNTER — Other Ambulatory Visit: Payer: Self-pay | Admitting: Podiatry

## 2019-08-19 ENCOUNTER — Other Ambulatory Visit: Payer: Self-pay

## 2019-08-20 ENCOUNTER — Encounter: Payer: Self-pay | Admitting: Family Medicine

## 2019-08-20 ENCOUNTER — Ambulatory Visit (INDEPENDENT_AMBULATORY_CARE_PROVIDER_SITE_OTHER): Payer: BC Managed Care – PPO | Admitting: Family Medicine

## 2019-08-20 VITALS — BP 130/82 | HR 56 | Temp 97.6°F | Ht 67.5 in | Wt 325.0 lb

## 2019-08-20 DIAGNOSIS — Z23 Encounter for immunization: Secondary | ICD-10-CM

## 2019-08-20 DIAGNOSIS — Z0000 Encounter for general adult medical examination without abnormal findings: Secondary | ICD-10-CM | POA: Diagnosis not present

## 2019-08-20 LAB — CBC WITH DIFFERENTIAL/PLATELET
Basophils Absolute: 0 10*3/uL (ref 0.0–0.1)
Basophils Relative: 0.7 % (ref 0.0–3.0)
Eosinophils Absolute: 0.4 10*3/uL (ref 0.0–0.7)
Eosinophils Relative: 5.8 % — ABNORMAL HIGH (ref 0.0–5.0)
HCT: 43.9 % (ref 39.0–52.0)
Hemoglobin: 14.2 g/dL (ref 13.0–17.0)
Lymphocytes Relative: 32.6 % (ref 12.0–46.0)
Lymphs Abs: 2.2 10*3/uL (ref 0.7–4.0)
MCHC: 32.3 g/dL (ref 30.0–36.0)
MCV: 75.8 fl — ABNORMAL LOW (ref 78.0–100.0)
Monocytes Absolute: 0.6 10*3/uL (ref 0.1–1.0)
Monocytes Relative: 8.9 % (ref 3.0–12.0)
Neutro Abs: 3.5 10*3/uL (ref 1.4–7.7)
Neutrophils Relative %: 52 % (ref 43.0–77.0)
Platelets: 223 10*3/uL (ref 150.0–400.0)
RBC: 5.8 Mil/uL (ref 4.22–5.81)
RDW: 16.2 % — ABNORMAL HIGH (ref 11.5–15.5)
WBC: 6.8 10*3/uL (ref 4.0–10.5)

## 2019-08-20 LAB — BASIC METABOLIC PANEL
BUN: 16 mg/dL (ref 6–23)
CO2: 29 mEq/L (ref 19–32)
Calcium: 10.3 mg/dL (ref 8.4–10.5)
Chloride: 106 mEq/L (ref 96–112)
Creatinine, Ser: 1.03 mg/dL (ref 0.40–1.50)
GFR: 73.63 mL/min (ref >60.00)
Glucose, Bld: 95 mg/dL (ref 70–99)
Potassium: 4.5 mEq/L (ref 3.5–5.1)
Sodium: 142 mEq/L (ref 135–145)

## 2019-08-20 LAB — HEPATIC FUNCTION PANEL
ALT: 30 U/L (ref 0–53)
AST: 24 U/L (ref 0–37)
Albumin: 4.4 g/dL (ref 3.5–5.2)
Alkaline Phosphatase: 72 U/L (ref 39–117)
Bilirubin, Direct: 0.2 mg/dL (ref 0.0–0.3)
Total Bilirubin: 1 mg/dL (ref 0.2–1.2)
Total Protein: 7.2 g/dL (ref 6.0–8.3)

## 2019-08-20 LAB — LIPID PANEL
Cholesterol: 185 mg/dL (ref 0–200)
HDL: 33.1 mg/dL — ABNORMAL LOW (ref >39.00)
LDL Cholesterol: 123 mg/dL — ABNORMAL HIGH (ref 0–99)
NonHDL: 151.7
Total CHOL/HDL Ratio: 6
Triglycerides: 146 mg/dL (ref 0.0–149.0)
VLDL: 29.2 mg/dL (ref 0.0–40.0)

## 2019-08-20 LAB — TSH: TSH: 2.19 u[IU]/mL (ref 0.35–4.50)

## 2019-08-20 LAB — PSA: PSA: 0.38 ng/mL (ref 0.10–4.00)

## 2019-08-20 NOTE — Patient Instructions (Addendum)
Health Maintenance Due  Topic Date Due  . COVID-19 Vaccine (1) Never done  . HIV Screening  Never done    Depression screen PHQ 2/9 11/14/2017  Decreased Interest 0  Down, Depressed, Hopeless 0  PHQ - 2 Score 0   Preventive Care 50-60 Years Old, Male Preventive care refers to lifestyle choices and visits with your health care provider that can promote health and wellness. This includes:  A yearly physical exam. This is also called an annual well check.  Regular dental and eye exams.  Immunizations.  Screening for certain conditions.  Healthy lifestyle choices, such as eating a healthy diet, getting regular exercise, not using drugs or products that contain nicotine and tobacco, and limiting alcohol use. What can I expect for my preventive care visit? Physical exam Your health care provider will check:  Height and weight. These may be used to calculate body mass index (BMI), which is a measurement that tells if you are at a healthy weight.  Heart rate and blood pressure.  Your skin for abnormal spots. Counseling Your health care provider may ask you questions about:  Alcohol, tobacco, and drug use.  Emotional well-being.  Home and relationship well-being.  Sexual activity.  Eating habits.  Work and work Statistician. What immunizations do I need?  Influenza (flu) vaccine  This is recommended every year. Tetanus, diphtheria, and pertussis (Tdap) vaccine  You may need a Td booster every 10 years. Varicella (chickenpox) vaccine  You may need this vaccine if you have not already been vaccinated. Zoster (shingles) vaccine  You may need this after age 36. Measles, mumps, and rubella (MMR) vaccine  You may need at least one dose of MMR if you were born in 1957 or later. You may also need a second dose. Pneumococcal conjugate (PCV13) vaccine  You may need this if you have certain conditions and were not previously vaccinated. Pneumococcal polysaccharide (PPSV23)  vaccine  You may need one or two doses if you smoke cigarettes or if you have certain conditions. Meningococcal conjugate (MenACWY) vaccine  You may need this if you have certain conditions. Hepatitis A vaccine  You may need this if you have certain conditions or if you travel or work in places where you may be exposed to hepatitis A. Hepatitis B vaccine  You may need this if you have certain conditions or if you travel or work in places where you may be exposed to hepatitis B. Haemophilus influenzae type b (Hib) vaccine  You may need this if you have certain risk factors. Human papillomavirus (HPV) vaccine  If recommended by your health care provider, you may need three doses over 6 months. You may receive vaccines as individual doses or as more than one vaccine together in one shot (combination vaccines). Talk with your health care provider about the risks and benefits of combination vaccines. What tests do I need? Blood tests  Lipid and cholesterol levels. These may be checked every 5 years, or more frequently if you are over 35 years old.  Hepatitis C test.  Hepatitis B test. Screening  Lung cancer screening. You may have this screening every year starting at age 10 if you have a 30-pack-year history of smoking and currently smoke or have quit within the past 15 years.  Prostate cancer screening. Recommendations will vary depending on your family history and other risks.  Colorectal cancer screening. All adults should have this screening starting at age 3 and continuing until age 39. Your health care provider  may recommend screening at age 66 if you are at increased risk. You will have tests every 1-10 years, depending on your results and the type of screening test.  Diabetes screening. This is done by checking your blood sugar (glucose) after you have not eaten for a while (fasting). You may have this done every 1-3 years.  Sexually transmitted disease (STD)  testing. Follow these instructions at home: Eating and drinking  Eat a diet that includes fresh fruits and vegetables, whole grains, lean protein, and low-fat dairy products.  Take vitamin and mineral supplements as recommended by your health care provider.  Do not drink alcohol if your health care provider tells you not to drink.  If you drink alcohol: ? Limit how much you have to 0-2 drinks a day. ? Be aware of how much alcohol is in your drink. In the [place] one drink equals one 12 oz bottle of beer (355 mL), one 5 oz glass of wine (148 mL), or one 1 oz glass of hard liquor (44 mL). Lifestyle  Take daily care of your teeth and gums.  Stay active. Exercise for at least 30 minutes on 5 or more days each week.  Do not use any products that contain nicotine or tobacco, such as cigarettes, e-cigarettes, and chewing tobacco. If you need help quitting, ask your health care provider.  If you are sexually active, practice safe sex. Use a condom or other form of protection to prevent STIs (sexually transmitted infections).  Talk with your health care provider about taking a low-dose aspirin every day starting at age 50. What's next?  Go to your health care provider once a year for a well check visit.  Ask your health care provider how often you should have your eyes and teeth checked.  Stay up to date on all vaccines. This information is not intended to replace advice given to you by your health care provider. Make sure you discuss any questions you have with your health care provider. Document Revised: 03/05/2018 Document Reviewed: 03/05/2018 Elsevier Patient Education  2020 [employer].

## 2019-08-20 NOTE — Progress Notes (Signed)
Subjective:     Patient ID: Antonio Watts, male   DOB: 1960/05/29, 59 y.o.   MRN: [id number]  HPI [name] is seen for physical exam.  History of hypertension, obstructive sleep apnea, obesity, history of kidney stones.  He is battling some fatigue issues.  He thinks a lot of this is working 50 to 60 hours/week and also erratic sleep as his work schedule changes constantly.  He does use CPAP consistently but still has increased fatigue.  He is due for repeat colonoscopy this year.  He needs repeat tetanus.  He has decided thus far against Covid vaccine.  He states he had shingles vaccine previously  Past Medical History:  Diagnosis Date  . Hypertension   . Kidney calculi   . Kidney stones   . Low testosterone   . OSA (obstructive sleep apnea)    History reviewed. No pertinent surgical history.  reports that he quit smoking about 8 years ago. His smoking use included cigarettes. He has a 35.00 pack-year smoking history. He has never used smokeless tobacco. He reports that he does not drink alcohol or use drugs. family history includes Cancer in an other family member; Heart disease in an other family member; Heart failure in an other family member; Pulmonary fibrosis in his mother. Allergies  Allergen Reactions  . Doxycycline Hyclate     REACTION: rash, swelling   Wt Readings from Last 3 Encounters:  08/20/19 (!) 325 lb (147.4 kg)  07/29/19 (!) 320 lb (145.2 kg)  06/21/19 (!) 340 lb 8 oz (154.4 kg)     Review of Systems  Constitutional: Positive for fatigue. Negative for activity change, appetite change, fever and unexpected weight change.  HENT: Negative for congestion, ear pain and trouble swallowing.   Eyes: Negative for pain and visual disturbance.  Respiratory: Negative for cough, shortness of breath and wheezing.   Cardiovascular: Negative for chest pain and palpitations.  Gastrointestinal: Negative for abdominal distention, abdominal pain, blood in stool, constipation,  diarrhea, nausea, rectal pain and vomiting.  Endocrine: Negative for polydipsia and polyuria.  Genitourinary: Negative for dysuria, hematuria and testicular pain.  Musculoskeletal: Negative for arthralgias and joint swelling.  Skin: Negative for rash.  Neurological: Negative for dizziness, syncope and headaches.  Hematological: Negative for adenopathy.  Psychiatric/Behavioral: Negative for confusion and dysphoric mood.       Objective:   Physical Exam Constitutional:      General: He is not in acute distress.    Appearance: He is well-developed.  HENT:     Head: Normocephalic and atraumatic.     Right Ear: External ear normal.     Left Ear: External ear normal.  Eyes:     Conjunctiva/sclera: Conjunctivae normal.     Pupils: Pupils are equal, round, and reactive to light.  Neck:     Thyroid: No thyromegaly.  Cardiovascular:     Rate and Rhythm: Normal rate and regular rhythm.     Heart sounds: Normal heart sounds. No murmur.  Pulmonary:     Effort: No respiratory distress.     Breath sounds: No wheezing or rales.  Abdominal:     General: Bowel sounds are normal. There is no distension.     Palpations: Abdomen is soft. There is no mass.     Tenderness: There is no abdominal tenderness. There is no guarding or rebound.  Musculoskeletal:     Cervical back: Normal range of motion and neck supple.     Right lower leg: No edema.  Left lower leg: No edema.  Lymphadenopathy:     Cervical: No cervical adenopathy.  Skin:    Findings: No rash.  Neurological:     Mental Status: He is alert and oriented to person, place, and time.     Cranial Nerves: No cranial nerve deficit.     Deep Tendon Reflexes: Reflexes normal.        Assessment:     Physical exam.  He has chronic medical problems as above.  His blood pressure stable today.  We discussed the following health maintenance issues    Plan:     -Obtain follow-up screening labs including PSA -DT booster given -Set up  repeat colonoscopy -Recommend continue with annual flu vaccine -Advised to lose some weight  Kristian Covey MD [facility] '

## 2019-08-30 ENCOUNTER — Encounter: Payer: Self-pay | Admitting: Gastroenterology

## 2019-09-07 ENCOUNTER — Other Ambulatory Visit: Payer: Self-pay

## 2019-09-08 ENCOUNTER — Ambulatory Visit (INDEPENDENT_AMBULATORY_CARE_PROVIDER_SITE_OTHER): Payer: BC Managed Care – PPO | Admitting: Family Medicine

## 2019-09-08 ENCOUNTER — Encounter: Payer: Self-pay | Admitting: Family Medicine

## 2019-09-08 VITALS — BP 132/84 | HR 67 | Temp 98.1°F | Wt 331.6 lb

## 2019-09-08 DIAGNOSIS — S39012A Strain of muscle, fascia and tendon of lower back, initial encounter: Secondary | ICD-10-CM | POA: Diagnosis not present

## 2019-09-08 MED ORDER — CYCLOBENZAPRINE HCL 10 MG PO TABS
10.0000 mg | ORAL_TABLET | Freq: Three times a day (TID) | ORAL | 0 refills | Status: DC | PRN
Start: 2019-09-08 — End: 2020-12-05

## 2019-09-08 MED ORDER — PREDNISONE 20 MG PO TABS
ORAL_TABLET | ORAL | 0 refills | Status: DC
Start: 2019-09-08 — End: 2019-12-08

## 2019-09-08 NOTE — Patient Instructions (Signed)
Lumbar Sprain A lumbar sprain, which is sometimes called a low-back sprain, is a stretch or tear in the bands of tissue that hold bones and joints together (ligaments) in the lower back (lumbar spine). This type of injury occurs when you overextend or stretch these ligaments beyond their limits. Lumbar sprains can range from mild to severe. Mild sprains may involve stretching a ligament without tearing it. These may heal in 1-2 weeks. More severe sprains involve tearing of the ligament. These will cause more pain and may take 6-8 weeks to heal. What are the causes? This condition may be caused by:  Trauma, such as a fall or a hit to the body.  Twisting or overstretching the back. This may result from doing activities that need a lot of energy, such as lifting heavy objects. What increases the risk? This injury is more common in:  Athletes.  People with obesity.  People who do repeated lifting, bending, or other movements that involve their back. What are the signs or symptoms? Symptoms of this condition may include:  Sharp or dull pain in the lower back that does not go away. The pain may extend to the buttocks.  Stiffness or limited range of motion.  Sudden muscle tightening (spasms). How is this diagnosed? This condition may be diagnosed based on:  Your symptoms.  Your medical history.  A physical exam.  Imaging tests, such as: ? X-rays. ? MRI. How is this treated? Treatment for this condition may include:  Resting the injured area.  Applying heat and cold to the affected area.  Medicines for pain and inflammation, such as NSAIDs.  Prescription pain medicine and muscle relaxants may be needed for a short time.  Physical therapy. Follow these instructions at home: Managing pain, stiffness, and swelling      If directed, put ice on the injured area during the first 24 hours after your injury. ? Put ice in a plastic bag. ? Place a towel between your skin and  the bag. ? Leave the ice on for 20 minutes, 2-3 times a day.  If directed, apply heat to the affected area as often as told by your health care provider. Use the heat source that your health care provider recommends, such as a moist heat pack or a heating pad. ? Place a towel between your skin and the heat source. ? Leave the heat on for 20-30 minutes. ? Remove the heat if your skin turns bright red. This is especially important if you are unable to feel pain, heat, or cold. You may have a greater risk of getting burned. Activity  Rest and return to your normal activities as told by your health care provider. Ask your health care provider what activities are safe for you.  Do exercises as told by your health care provider. Medicines  Take over-the-counter and prescription medicines only as told by your health care provider.  Ask your health care provider if the medicine prescribed to you: ? Requires you to avoid driving or using heavy machinery. ? Can cause constipation. You may need to take these actions to prevent or treat constipation:  Drink enough fluid to keep your urine pale yellow.  Take over-the-counter or prescription medicines.  Eat foods that are high in fiber, such as beans, whole grains, and fresh fruits and vegetables.  Limit foods that are high in fat and processed sugars, such as fried or sweet foods. Injury prevention To prevent a future low-back injury:  Always warm up properly   before physical activity or sports.  Cool down and stretch after being active.  Use correct form when playing sports and lifting heavy objects. Bend your knees before you lift heavy objects.  Use good posture when sitting and standing.  Stay physically fit and keep a healthy weight. ? Do at least 150 minutes of moderate-intensity exercise each week, such as brisk walking or water aerobics. ? Do strength exercises at least 2 times each week.  General instructions  Do not use any  products that contain nicotine or tobacco, such as cigarettes, e-cigarettes, and chewing tobacco. If you need help quitting, ask your health care provider.  Keep all follow-up visits as told by your health care provider. This is important. Contact a health care provider if:  Your back pain does not improve after 6 weeks of treatment.  Your symptoms get worse. Get help right away if:  Your back pain is severe.  You are unable to stand or walk.  You develop pain in your legs.  You develop weakness in your buttocks or legs.  You have difficulty controlling when you urinate or when you have a bowel movement. ? You have frequent, painful, or bloody urination. ? You have a temperature over 101.0F (38.3C). Summary  A lumbar sprain, which is sometimes called a low-back sprain, is a stretch or tear in the bands of tissue that hold bones and joints together (ligaments) in the lower back (lumbar spine).  Rest and return to your normal activities as told by your health care provider. Ask your health care provider what activities are safe for you.  If directed, apply heat and ice to the affected area as often as told by your health care provider.  Take over-the-counter and prescription medicines only as told by your health care provider.  Contact a health care provider if you have new or worsening symptoms. This information is not intended to replace advice given to you by your health care provider. Make sure you discuss any questions you have with your health care provider. Document Revised: 01/08/2018 Document Reviewed: 01/08/2018 Elsevier Patient Education  2020 Elsevier Inc[employer]  

## 2019-09-08 NOTE — Progress Notes (Signed)
Established Patient Office Visit  Subjective:  Patient ID: Antonio Watts, male    DOB: [birth year]  Age: 59 y.o. MRN: [id number]  CC:  Chief Complaint  Patient presents with  . Back Pain    right side and radiates down to right hip has been going on for a few weeks but has gotten worse     HPI Antonio Watts presents for acute lumbar back pain.  Started about 2 weeks ago.  His job is fairly physical job and requires frequent bending and lifting.  He noticed some onset about 2 weeks ago at work.  He has pain right lower lumbar area without radiculitis symptoms.  Pain is worse when he straightens up from a flexed position.  Pain is also worse when he is flexing the back.  He has not had any weakness or numbness lower extremities.  He has tried some heat and ice and also takes daily meloxicam and has not seen much improvement.  No chronic back issues.  Past Medical History:  Diagnosis Date  . Hypertension   . Kidney calculi   . Kidney stones   . Low testosterone   . OSA (obstructive sleep apnea)     No past surgical history on file.  Family History  Problem Relation Age of Onset  . Cancer Other        fhx  . Heart disease Other        fhx  . Heart failure Other   . Pulmonary fibrosis Mother     Social History   Socioeconomic History  . Marital status: Married    Spouse name: Not on file  . Number of children: Not on file  . Years of education: Not on file  . Highest education level: Not on file  Occupational History  . Not on file  Tobacco Use  . Smoking status: Former Smoker    Packs/day: 1.00    Years: 35.00    Pack years: 35.00    Types: Cigarettes    Quit date: 05/26/2011    Years since quitting: 8.2  . Smokeless tobacco: Never Used  Vaping Use  . Vaping Use: Never used  Substance and Sexual Activity  . Alcohol use: No  . Drug use: No  . Sexual activity: Not on file  Other Topics Concern  . Not on file  Social History Narrative  . Not on file    Social Determinants of Health   Financial Resource Strain:   . Difficulty of Paying Living Expenses:   Food Insecurity:   . Worried About Programme researcher, broadcasting/film/video in the Last Year:   . Barista in the Last Year:   Transportation Needs:   . Freight forwarder (Medical):   Marland Kitchen Lack of Transportation (Non-Medical):   Physical Activity:   . Days of Exercise per Week:   . Minutes of Exercise per Session:   Stress:   . Feeling of Stress :   Social Connections:   . Frequency of Communication with Friends and Family:   . Frequency of Social Gatherings with Friends and Family:   . Attends Religious Services:   . Active Member of Clubs or Organizations:   . Attends Banker Meetings:   Marland Kitchen Marital Status:   Intimate Partner Violence:   . Fear of Current or Ex-Partner:   . Emotionally Abused:   Marland Kitchen Physically Abused:   . Sexually Abused:     Outpatient Medications Prior to Visit  Medication Sig Dispense Refill  . b complex vitamins tablet Take 1 tablet by mouth daily.    Marland Kitchen gabapentin (NEURONTIN) 300 MG capsule TAKE 1 CAPSULE(300 MG) BY MOUTH THREE TIMES DAILY 90 capsule 3  . lisinopril (ZESTRIL) 10 MG tablet TAKE 1 TABLET(10 MG) BY MOUTH DAILY 90 tablet 2  . MELATONIN PO Take by mouth.    . meloxicam (MOBIC) 15 MG tablet TAKE 1 TABLET(15 MG) BY MOUTH DAILY 30 tablet 3  . Multiple Vitamin (MULTIVITAMIN WITH MINERALS) TABS tablet Take 1 tablet by mouth daily.    . NON FORMULARY CPAP at bedtime     . ondansetron (ZOFRAN ODT) 4 MG disintegrating tablet Allow 1-2 tablets to dissolve in your mouth every 8 hours as needed for nausea/vomiting 30 tablet 0   No facility-administered medications prior to visit.    Allergies  Allergen Reactions  . Doxycycline Hyclate     REACTION: rash, swelling    ROS Review of Systems  Constitutional: Negative for activity change, appetite change and fever.  Respiratory: Negative for cough and shortness of breath.   Cardiovascular:  Negative for chest pain and leg swelling.  Gastrointestinal: Negative for abdominal pain and vomiting.  Genitourinary: Negative for dysuria, flank pain and hematuria.  Musculoskeletal: Positive for back pain. Negative for joint swelling.  Neurological: Negative for weakness and numbness.      Objective:    Physical Exam Constitutional:      General: He is not in acute distress.    Appearance: He is well-developed.  Neck:     Thyroid: No thyromegaly.  Cardiovascular:     Rate and Rhythm: Normal rate and regular rhythm.     Heart sounds: Normal heart sounds. No murmur heard.   Pulmonary:     Effort: Pulmonary effort is normal. No respiratory distress.     Breath sounds: Normal breath sounds. No wheezing or rales.  Skin:    Findings: No rash.  Neurological:     Mental Status: He is alert and oriented to person, place, and time.     Cranial Nerves: No cranial nerve deficit.     Deep Tendon Reflexes: Reflexes are normal and symmetric.     BP 132/84 (BP Location: Left Arm, Patient Position: Sitting, Cuff Size: Normal)   Pulse 67   Temp 98.1 F (36.7 C) (Temporal)   Wt (!) 331 lb 9.6 oz (150.4 kg)   SpO2 96%   BMI 51.17 kg/m  Wt Readings from Last 3 Encounters:  09/08/19 (!) 331 lb 9.6 oz (150.4 kg)  08/20/19 (!) 325 lb (147.4 kg)  07/29/19 (!) 320 lb (145.2 kg)     Health Maintenance Due  Topic Date Due  . COVID-19 Vaccine (1) Never done  . HIV Screening  Never done    There are no preventive care reminders to display for this patient.  Lab Results  Component Value Date   TSH 2.19 08/20/2019   Lab Results  Component Value Date   WBC 6.8 08/20/2019   HGB 14.2 08/20/2019   HCT 43.9 08/20/2019   MCV 75.8 (L) 08/20/2019   PLT 223.0 08/20/2019   Lab Results  Component Value Date   NA 142 08/20/2019   K 4.5 08/20/2019   CO2 29 08/20/2019   GLUCOSE 95 08/20/2019   BUN 16 08/20/2019   CREATININE 1.03 08/20/2019   BILITOT 1.0 08/20/2019   ALKPHOS 72  08/20/2019   AST 24 08/20/2019   ALT 30 08/20/2019   PROT 7.2 08/20/2019  ALBUMIN 4.4 08/20/2019   CALCIUM 10.3 08/20/2019   ANIONGAP 7 07/29/2019   GFR 73.63 08/20/2019   Lab Results  Component Value Date   CHOL 185 08/20/2019   Lab Results  Component Value Date   HDL 33.10 (L) 08/20/2019   Lab Results  Component Value Date   LDLCALC 123 (H) 08/20/2019   Lab Results  Component Value Date   TRIG 146.0 08/20/2019   Lab Results  Component Value Date   CHOLHDL 6 08/20/2019   Lab Results  Component Value Date   HGBA1C  09/27/2008    5.6 (NOTE) The ADA recommends the following therapeutic goal for glycemic control related to Hgb A1c measurement: Goal of therapy: <6.5 Hgb A1c  Reference: American Diabetes Association: Clinical Practice Recommendations 2010, Diabetes Care, 2010, 33: (Suppl  1).      Assessment & Plan:   Acute right lumbar back pain.  Suspect muscular.  No radiculitis symptoms at this time.  Nonfocal neuro exam  -Prednisone taper as below along with cautious trial of Flexeril and he is cautioned about sedation  -Continue heat or ice for symptom relief along with muscle massage and topical sports creams  -Consider trial of physical therapy if not improving by next week  -Work note was written for the remainder of this week  Meds ordered this encounter  Medications  . predniSONE (DELTASONE) 20 MG tablet    Sig: Taper as follows: 3-3-2-2-2-1-1-1    Dispense:  15 tablet    Refill:  0  . cyclobenzaprine (FLEXERIL) 10 MG tablet    Sig: Take 1 tablet (10 mg total) by mouth 3 (three) times daily as needed for muscle spasms.    Dispense:  30 tablet    Refill:  0    Follow-up: No follow-ups on file.    Evelena Peat, MD

## 2019-10-06 ENCOUNTER — Telehealth: Payer: Self-pay

## 2019-10-06 NOTE — Telephone Encounter (Signed)
There are no open hospital dates with Dr. Russella Dar through the end of September.  Will be October at the earliest, but this schedule is not out yet.

## 2019-10-06 NOTE — Telephone Encounter (Signed)
Dr. Russella Dar:  Pt's BMI is 51.14 as of 09/08/19.  Current wt 331.9  Would you like an OV or direct to [facility]?  Thank you

## 2019-10-06 NOTE — Telephone Encounter (Signed)
Direct colonoscopy at [facility].

## 2019-10-06 NOTE — Telephone Encounter (Signed)
Sheri;  Please schedule this pt for a direct at [facility] Per Dr. Anselm Jungling direction.  Thank you

## 2019-10-06 NOTE — Telephone Encounter (Signed)
Attempted to reach pt to advise him of the need to do procedure at [facility] vs [facility] d/t BMI; that the next available appt will be in October when the schedule comes out; and that PV and Procedure is cancelled until that time.  Unable to reach pt, left message for pt to return call at his earliest convenience. Marland Kitchen

## 2019-10-29 ENCOUNTER — Encounter: Payer: BC Managed Care – PPO | Admitting: Gastroenterology

## 2019-11-15 ENCOUNTER — Other Ambulatory Visit: Payer: Self-pay | Admitting: Family Medicine

## 2019-11-15 NOTE — Telephone Encounter (Signed)
Pt wife call and stated he need cpap sent to [facility] in [place]

## 2019-11-16 NOTE — Telephone Encounter (Signed)
Which supplies are they needing?  All or specific ones?  See if they know current CPAP settings.

## 2019-11-16 NOTE — Telephone Encounter (Signed)
Please advise 

## 2019-11-19 ENCOUNTER — Telehealth: Payer: Self-pay | Admitting: Family Medicine

## 2019-11-19 DIAGNOSIS — G4733 Obstructive sleep apnea (adult) (pediatric): Secondary | ICD-10-CM

## 2019-11-19 NOTE — Telephone Encounter (Signed)
Patient states he needs a new prescription per the company that supplies him with his Cpap supplies.  He states he has been wearing it for over 10 years and has never been asked to do this before.  He was instructed to wear his cpap after a study 10 years ago when he was going to [employer].

## 2019-11-24 NOTE — Telephone Encounter (Signed)
Please advise  Would pt need an appt with you to refer to pulmonary for new O2?

## 2019-11-25 NOTE — Telephone Encounter (Signed)
Go ahead and set up with pulmonary.

## 2019-11-26 NOTE — Telephone Encounter (Signed)
Referral placed.

## 2019-11-30 NOTE — Telephone Encounter (Signed)
Called pt to see if this was taking care of but no answer.

## 2019-11-30 NOTE — Telephone Encounter (Signed)
Pt wife called back to say they still need the filters.   She said to call her back at [phone]

## 2019-12-02 DIAGNOSIS — Z20822 Contact with and (suspected) exposure to covid-19: Secondary | ICD-10-CM | POA: Diagnosis not present

## 2019-12-02 NOTE — Telephone Encounter (Signed)
Spoke to pt wife and she state that she just wanted a call when and if the Rx will send to [place].

## 2019-12-02 NOTE — Telephone Encounter (Signed)
Not sure I haven't ordered CPAP supplies normally send to pulmonary. I can get help

## 2019-12-02 NOTE — Telephone Encounter (Signed)
Can you send as a DME order ?

## 2019-12-03 NOTE — Telephone Encounter (Signed)
There is no DME order for just the filters. Filled out hard copy Rx and placed in your red folder to be signed for pt.

## 2019-12-06 DIAGNOSIS — G4733 Obstructive sleep apnea (adult) (pediatric): Secondary | ICD-10-CM | POA: Diagnosis not present

## 2019-12-06 NOTE — Telephone Encounter (Signed)
Rx faxed

## 2019-12-06 NOTE — Telephone Encounter (Signed)
Done. Signed.

## 2019-12-06 NOTE — Telephone Encounter (Signed)
I left Lawson Fiscal a voicemail letting her know the Rx has been faxed & to call back with any questions.

## 2019-12-07 ENCOUNTER — Other Ambulatory Visit: Payer: Self-pay | Admitting: Podiatry

## 2019-12-07 ENCOUNTER — Other Ambulatory Visit: Payer: Self-pay | Admitting: Family Medicine

## 2019-12-07 NOTE — Telephone Encounter (Signed)
Spoke with the pt and informed him the refill will be sent to the pharmacy and an appt is needed for evaluation as below.  Appt scheduled for tomorrow at 7am.

## 2019-12-07 NOTE — Telephone Encounter (Signed)
Patient is needing a Z Pack called in. He feels like he has a sinus infection coming on. He can taste it in his throat.   [id number] [place],  - [phone] AT [facility] OF [address] Phone:  [phone]  Fax:  [phone]

## 2019-12-08 ENCOUNTER — Other Ambulatory Visit: Payer: Self-pay

## 2019-12-08 ENCOUNTER — Ambulatory Visit (INDEPENDENT_AMBULATORY_CARE_PROVIDER_SITE_OTHER): Payer: BC Managed Care – PPO | Admitting: Family Medicine

## 2019-12-08 DIAGNOSIS — J0110 Acute frontal sinusitis, unspecified: Secondary | ICD-10-CM | POA: Diagnosis not present

## 2019-12-08 MED ORDER — AMOXICILLIN-POT CLAVULANATE 875-125 MG PO TABS
1.0000 | ORAL_TABLET | Freq: Two times a day (BID) | ORAL | 0 refills | Status: DC
Start: 2019-12-08 — End: 2020-02-23

## 2019-12-08 NOTE — Progress Notes (Signed)
Patient ID: Antonio Watts, male   DOB: 1961-03-20, 59 y.o.   MRN: [id number]   This visit type was conducted due to national recommendations for restrictions regarding the COVID-19 pandemic in an effort to limit this patient's exposure and mitigate transmission in our community.   Virtual Visit via Telephone Note  I connected with Antonio Watts on 12/08/19 at  7:00 AM EDT by telephone and verified that I am speaking with the correct person using two identifiers.   I discussed the limitations, risks, security and privacy concerns of performing an evaluation and management service by telephone and the availability of in person appointments. I also discussed with the patient that there may be a patient responsible charge related to this service. The patient expressed understanding and agreed to proceed.  Location patient: home Location provider: work or home office Participants present for the call: patient, provider Patient did not have a visit in the prior 7 days to address this/these issue(s).   History of Present Illness: Antonio Watts called with respiratory symptoms.  He states he had onset over week ago of nasal congestion and now has cough productive of green sputum.  He has had some colored nasal discharge.  No fever.  He had Covid test on 12/02/2019 which came back negative.  He tried over-the-counter medications without much improvement.  He has increasing fatigue.  Frontal sinus pressure bilaterally.  Frequent headaches.  No dyspnea.  He is having some difficulty sleeping secondary to the sinus pressure.  He has allergy to doxycycline.  Past Medical History:  Diagnosis Date  . Hypertension   . Kidney calculi   . Kidney stones   . Low testosterone   . OSA (obstructive sleep apnea)    No past surgical history on file.  reports that he quit smoking about 8 years ago. His smoking use included cigarettes. He has a 35.00 pack-year smoking history. He has never used smokeless tobacco. He  reports that he does not drink alcohol and does not use drugs. family history includes Cancer in an other family member; Heart disease in an other family member; Heart failure in an other family member; Pulmonary fibrosis in his mother. Allergies  Allergen Reactions  . Doxycycline Hyclate     REACTION: rash, swelling      Observations/Objective: Patient sounds cheerful and well on the phone. I do not appreciate any SOB. Speech and thought processing are grossly intact. Patient reported vitals:  Assessment and Plan:  Probable acute frontal sinusitis  -Start Augmentin 875 mg twice daily with food. -Stay well-hydrated -Consider saline nasal irrigation with distilled water -Follow-up if not improving in the next week  Follow Up Instructions:  -As above, as needed   99441 5-10 99442 11-20 99443 21-30 I did not refer this patient for an OV in the next 24 hours for this/these issue(s).  I discussed the assessment and treatment plan with the patient. The patient was provided an opportunity to ask questions and all were answered. The patient agreed with the plan and demonstrated an understanding of the instructions.   The patient was advised to call back or seek an in-person evaluation if the symptoms worsen or if the condition fails to improve as anticipated.  I provided 12 minutes of non-face-to-face time during this encounter.   Evelena Peat, MD

## 2020-01-03 ENCOUNTER — Telehealth: Payer: Self-pay | Admitting: Family Medicine

## 2020-01-03 NOTE — Telephone Encounter (Signed)
Patient got transferred here to our office from a robocall trying to schedule the patient for a colonoscopy.  Patient will need a call back to access this.

## 2020-01-03 NOTE — Telephone Encounter (Signed)
Spoke with pt state that he received a call stating that pt should have a colonoscopy done at the hospital, due to  His BMI. Pt declined to have a referral for colonoscopy placed state that he will not go to the hospital for this procedure and that he will wait do have one done at the beginning of the year at the GI office. Advised to call then and notify the office so we can place referral. Verbalized understanding

## 2020-01-20 ENCOUNTER — Ambulatory Visit: Payer: BC Managed Care – PPO | Admitting: Family Medicine

## 2020-02-23 ENCOUNTER — Telehealth (INDEPENDENT_AMBULATORY_CARE_PROVIDER_SITE_OTHER): Payer: BC Managed Care – PPO | Admitting: Family Medicine

## 2020-02-23 ENCOUNTER — Encounter: Payer: Self-pay | Admitting: Family Medicine

## 2020-02-23 VITALS — Ht 67.5 in | Wt 315.0 lb

## 2020-02-23 DIAGNOSIS — J0191 Acute recurrent sinusitis, unspecified: Secondary | ICD-10-CM

## 2020-02-23 MED ORDER — AMOXICILLIN-POT CLAVULANATE 875-125 MG PO TABS
1.0000 | ORAL_TABLET | Freq: Two times a day (BID) | ORAL | 0 refills | Status: DC
Start: 2020-02-23 — End: 2020-04-11

## 2020-02-23 NOTE — Progress Notes (Signed)
   Subjective:    Patient ID: Antonio Watts, male    DOB: 1960/07/08, 59 y.o.   MRN: [id number]  HPI Virtual Visit via Telephone Note  I connected with the patient on 02/23/20 at  3:15 PM EST by telephone and verified that I am speaking with the correct person using two identifiers.   I discussed the limitations, risks, security and privacy concerns of performing an evaluation and management service by telephone and the availability of in person appointments. I also discussed with the patient that there may be a patient responsible charge related to this service. The patient expressed understanding and agreed to proceed.  Location patient: home Location provider: work or home office Participants present for the call: patient, provider Patient did not have a visit in the prior 7 days to address this/these issue(s).   History of Present Illness: Here for one week of sinus pressure, PND, ST, a dry cough, and blowing green mucus from the nose. No fever or SOB. No body aches or NVD. He has a hx of recurrent sinus infections, and in fact he was treated for one in September. Using Nyquil and Dayquil.    Observations/Objective: Patient sounds cheerful and well on the phone. I do not appreciate any SOB. Speech and thought processing are grossly intact. Patient reported vitals:  Assessment and Plan: Sinusitis, treat with Augmentin. Recheck prn.  Gershon Crane, MD   Follow Up Instructions:     [phone] 5-10 [phone] 11-20 [id number] 21-30 I did not refer this patient for an OV in the next 24 hours for this/these issue(s).  I discussed the assessment and treatment plan with the patient. The patient was provided an opportunity to ask questions and all were answered. The patient agreed with the plan and demonstrated an understanding of the instructions.   The patient was advised to call back or seek an in-person evaluation if the symptoms worsen or if the condition fails to improve as  anticipated.  I provided 10 minutes of non-face-to-face time during this encounter.   Gershon Crane, MD    Review of Systems     Objective:   Physical Exam        Assessment & Plan:

## 2020-04-01 ENCOUNTER — Other Ambulatory Visit: Payer: Self-pay | Admitting: Podiatry

## 2020-04-01 ENCOUNTER — Other Ambulatory Visit: Payer: Self-pay | Admitting: Family Medicine

## 2020-04-02 NOTE — Telephone Encounter (Signed)
Please advise 

## 2020-04-04 ENCOUNTER — Telehealth (INDEPENDENT_AMBULATORY_CARE_PROVIDER_SITE_OTHER): Payer: BC Managed Care – PPO | Admitting: Family Medicine

## 2020-04-04 ENCOUNTER — Other Ambulatory Visit: Payer: BC Managed Care – PPO

## 2020-04-04 DIAGNOSIS — J069 Acute upper respiratory infection, unspecified: Secondary | ICD-10-CM | POA: Diagnosis not present

## 2020-04-04 DIAGNOSIS — Z20822 Contact with and (suspected) exposure to covid-19: Secondary | ICD-10-CM | POA: Diagnosis not present

## 2020-04-04 NOTE — Progress Notes (Signed)
Patient ID: Antonio Watts, male   DOB: 02-29-1961, 60 y.o.   MRN: [id number]  This visit type was conducted due to national recommendations for restrictions regarding the COVID-19 pandemic in an effort to limit this patient's exposure and mitigate transmission in our community.   Virtual Visit via Telephone Note  I connected with Firmin Belisle on 04/04/20 at  9:45 AM EST by telephone and verified that I am speaking with the correct person using two identifiers.   I discussed the limitations, risks, security and privacy concerns of performing an evaluation and management service by telephone and the availability of in person appointments. I also discussed with the patient that there may be a patient responsible charge related to this service. The patient expressed understanding and agreed to proceed.  Location patient: home Location provider: work or home office Participants present for the call: patient, provider Patient did not have a visit in the prior 7 days to address this/these issue(s).   History of Present Illness: Auden relates onset this past Sunday 2 days ago of low-grade fever 100.3, chills, body aches, mild cough.  Symptoms have progressed somewhat since then.  No loss of taste or smell.  No dyspnea at rest.  Has had some mild nausea and took one of his wife's nausea medications which has helped.  No vomiting.  No diarrhea.  No dysuria.  No recent sick contacts.  Has not been COVID vaccinated.  He has COVID test lined up for 11 AM later today.  Taking over-the-counter NyQuil and Advil which are providing some mild relief.  Increased fatigue.  Cough occasionally productive of colored sputum.  He has comorbidities including age, obesity, hypertension  Past Medical History:  Diagnosis Date  . Hypertension   . Kidney calculi   . Kidney stones   . Low testosterone   . OSA (obstructive sleep apnea)    No past surgical history on file.  reports that he quit smoking about 8 years  ago. His smoking use included cigarettes. He has a 35.00 pack-year smoking history. He has never used smokeless tobacco. He reports that he does not drink alcohol and does not use drugs. family history includes Cancer in an other family member; Heart disease in an other family member; Heart failure in an other family member; Pulmonary fibrosis in his mother. Allergies  Allergen Reactions  . Doxycycline Hyclate     REACTION: rash, swelling      Observations/Objective: Patient sounds cheerful and well on the phone. I do not appreciate any SOB. Speech and thought processing are grossly intact. Patient reported vitals:  Assessment and Plan:  Acute onset 2 days ago of low-grade fever, body aches, fatigue, cough.  Rule out COVID 19 infection  -Patient has testing later today at 11 AM -Stay isolated until further clarified -Plenty of fluids and rest -We instructed him to notify us immediately when he hears back regarding his test results.  Given his comorbidities would consider referral to monoclonal antibody infusion clinic as soon as we can find out.  He was also given number and link to the respiratory clinic on [address]  Follow Up Instructions:  -as above.   99441 5-10 99442 11-20 99443 21-30 I did not refer this patient for an OV in the next 24 hours for this/these issue(s).  I discussed the assessment and treatment plan with the patient. The patient was provided an opportunity to ask questions and all were answered. The patient agreed with the plan and demonstrated an  understanding of the instructions.   The patient was advised to call back or seek an in-person evaluation if the symptoms worsen or if the condition fails to improve as anticipated.  I provided 13 minutes of non-face-to-face time during this encounter.   Evelena Peat, MD

## 2020-04-05 LAB — NOVEL CORONAVIRUS, NAA: SARS-CoV-2, NAA: DETECTED — AB

## 2020-04-05 LAB — SARS-COV-2, NAA 2 DAY TAT

## 2020-04-06 ENCOUNTER — Telehealth: Payer: Self-pay | Admitting: Family Medicine

## 2020-04-06 DIAGNOSIS — U071 COVID-19: Secondary | ICD-10-CM

## 2020-04-06 NOTE — Telephone Encounter (Signed)
Because of his co-morbidities I did make referral to Covid treatment center.  He is high risk, but thankfully sounds like is doing OK currently.

## 2020-04-06 NOTE — Telephone Encounter (Signed)
Patient informed of the message below.

## 2020-04-06 NOTE — Telephone Encounter (Signed)
Pt is calling to let Dr. Caryl Never know that he did test positive for COVID, but he feels fine just has a slight cough and a little congestion.  Pt did not know if there is something else that Dr. Caryl Never wanted to do.

## 2020-04-06 NOTE — Telephone Encounter (Signed)
Pt called back stating he tested positive for COVID is now experiencing a fever 100.3. Just wanted to let Dr. Caryl Never know.

## 2020-04-07 ENCOUNTER — Other Ambulatory Visit (HOSPITAL_COMMUNITY): Payer: Self-pay | Admitting: Internal Medicine

## 2020-04-07 ENCOUNTER — Encounter (HOSPITAL_COMMUNITY): Payer: Self-pay

## 2020-04-07 DIAGNOSIS — U071 COVID-19: Secondary | ICD-10-CM

## 2020-04-07 DIAGNOSIS — I10 Essential (primary) hypertension: Secondary | ICD-10-CM

## 2020-04-07 NOTE — Progress Notes (Signed)
I connected by phone with Antonio Watts on 04/07/2020 at 6:51 PM to discuss the potential use of a new treatment for mild to moderate COVID-19 viral infection in non-hospitalized patients.  This patient is a 60 y.o. male that meets the FDA criteria for Emergency Use Authorization of COVID monoclonal antibody sotrovimab.  Has a (+) direct SARS-CoV-2 viral test result  Has mild or moderate COVID-19   Is NOT hospitalized due to COVID-19  Is within 10 days of symptom onset  Has at least one of the high risk factor(s) for progression to severe COVID-19 and/or hospitalization as defined in EUA.  Specific high risk criteria : BMI > 25   I have spoken and communicated the following to the patient or parent/caregiver regarding COVID monoclonal antibody treatment:  1. FDA has authorized the emergency use for the treatment of mild to moderate COVID-19 in adults and pediatric patients with positive results of direct SARS-CoV-2 viral testing who are 37 years of age and older weighing at least 40 kg, and who are at high risk for progressing to severe COVID-19 and/or hospitalization.  2. The significant known and potential risks and benefits of COVID monoclonal antibody, and the extent to which such potential risks and benefits are unknown.  3. Information on available alternative treatments and the risks and benefits of those alternatives, including clinical trials.  4. Patients treated with COVID monoclonal antibody should continue to self-isolate and use infection control measures (e.g., wear mask, isolate, social distance, avoid sharing personal items, clean and disinfect "high touch" surfaces, and frequent handwashing) according to CDC guidelines.   5. The patient or parent/caregiver has the option to accept or refuse COVID monoclonal antibody treatment.  After reviewing this information with the patient, the patient has agreed to receive one of the available covid 19 monoclonal antibodies and  will be provided an appropriate fact sheet prior to infusion.  Cyndee Brightly, NP [facility]

## 2020-04-08 ENCOUNTER — Ambulatory Visit (HOSPITAL_COMMUNITY)
Admission: RE | Admit: 2020-04-08 | Discharge: 2020-04-08 | Disposition: A | Discharge status: Home / Self Care | Payer: BC Managed Care – PPO | Source: Ambulatory Visit | Attending: Pulmonary Disease | Admitting: Pulmonary Disease

## 2020-04-08 DIAGNOSIS — U071 COVID-19: Secondary | ICD-10-CM | POA: Diagnosis not present

## 2020-04-08 DIAGNOSIS — I10 Essential (primary) hypertension: Secondary | ICD-10-CM | POA: Diagnosis not present

## 2020-04-08 DIAGNOSIS — Z6841 Body Mass Index (BMI) 40.0 and over, adult: Secondary | ICD-10-CM | POA: Insufficient documentation

## 2020-04-08 MED ORDER — SODIUM CHLORIDE 0.9 % IV SOLN: INTRAVENOUS | Status: DC | PRN

## 2020-04-08 MED ORDER — DIPHENHYDRAMINE HCL 50 MG/ML IJ SOLN: 50.0000 mg | Freq: Once | INTRAMUSCULAR | Status: DC | PRN

## 2020-04-08 MED ORDER — METHYLPREDNISOLONE SODIUM SUCC 125 MG IJ SOLR: 125.0000 mg | Freq: Once | INTRAMUSCULAR | Status: DC | PRN

## 2020-04-08 MED ORDER — SOTROVIMAB 500 MG/8ML IV SOLN
500.0000 mg | Freq: Once | INTRAVENOUS | Status: AC
  Administered 2020-04-08: 500 mg via INTRAVENOUS

## 2020-04-08 MED ORDER — ALBUTEROL SULFATE HFA 108 (90 BASE) MCG/ACT IN AERS: 2.0000 | INHALATION_SPRAY | Freq: Once | RESPIRATORY_TRACT | Status: DC | PRN

## 2020-04-08 MED ORDER — FAMOTIDINE IN NACL 20-0.9 MG/50ML-% IV SOLN: 20.0000 mg | Freq: Once | INTRAVENOUS | Status: DC | PRN

## 2020-04-08 MED ORDER — EPINEPHRINE 0.3 MG/0.3ML IJ SOAJ: 0.3000 mg | Freq: Once | INTRAMUSCULAR | Status: DC | PRN

## 2020-04-08 NOTE — Progress Notes (Signed)
Patient reviewed Fact Sheet for Patients, Parents, and Caregivers for Emergency Use Authorization (EUA) of sotrovimab for the Treatment of Coronavirus. Patient also reviewed and is agreeable to the estimated cost of treatment. Patient is agreeable to proceed.   

## 2020-04-08 NOTE — Progress Notes (Signed)
Diagnosis: COVID-19  Physician: Dr. Patrick Wright  Procedure: Covid Infusion Clinic Med: Sotrovimab infusion - Provided patient with sotrovimab fact sheet for patients, parents, and caregivers prior to infusion.   Complications: No immediate complications noted  Discharge: Discharged home    

## 2020-04-08 NOTE — Discharge Instructions (Signed)

## 2020-04-11 ENCOUNTER — Encounter: Payer: Self-pay | Admitting: Family Medicine

## 2020-04-11 ENCOUNTER — Telehealth: Payer: BC Managed Care – PPO | Admitting: Family Medicine

## 2020-04-11 DIAGNOSIS — U071 COVID-19: Secondary | ICD-10-CM

## 2020-04-11 NOTE — Progress Notes (Signed)
Patient ID: Antonio Watts, male   DOB: May 14, 1960, 60 y.o.   MRN: [id number]  This visit type was conducted due to national recommendations for restrictions regarding the COVID-19 pandemic in an effort to limit this patient's exposure and mitigate transmission in our community.   Virtual Visit via Telephone Note  I connected with Antonio Watts on 04/11/20 at  2:15 PM EST by telephone and verified that I am speaking with the correct person using two identifiers.   I discussed the limitations, risks, security and privacy concerns of performing an evaluation and management service by telephone and the availability of in person appointments. I also discussed with the patient that there may be a patient responsible charge related to this service. The patient expressed understanding and agreed to proceed.  Location patient: home Location provider: work or home office Participants present for the call: patient, provider Patient did not have a visit in the prior 7 days to address this/these issue(s).   History of Present Illness:  Spoke with patient. He had called last week with respiratory symptoms. We had urged him to get COVID tested. This came back positive. We made referral for monoclonal antibody infusion clinic and he did get called and went in Saturday for infusion. He almost immediately felt better. He was afebrile within less than 24 hours. He calls today overall feeling better but with some cough productive of brown sputum. He had some bloody and occasional yellow nasal discharge. Tendency to have sinusitis in the past. No further fevers. No facial pain. No headaches. Cough is much better. No dyspnea. He thinks he may try to return to work tomorrow.  Past Medical History:  Diagnosis Date  . Hypertension   . Kidney calculi   . Kidney stones   . Low testosterone   . OSA (obstructive sleep apnea)    History reviewed. No pertinent surgical history.  reports that he quit smoking about 8  years ago. His smoking use included cigarettes. He has a 35.00 pack-year smoking history. He has never used smokeless tobacco. He reports that he does not drink alcohol and does not use drugs. family history includes Cancer in an other family member; Heart disease in an other family member; Heart failure in an other family member; Pulmonary fibrosis in his mother. Allergies  Allergen Reactions  . Doxycycline Hyclate     REACTION: rash, swelling      Observations/Objective: Patient sounds cheerful and well on the phone. I do not appreciate any SOB. Speech and thought processing are grossly intact. Patient reported vitals:  Assessment and Plan:  Recent COVID-19 infection. Clinically much improved following monoclonal antibody infusion. He has some cough productive of brown sputum and yellowish nasal discharge. We discussed the fact that colored sputum and colored nasal discharge is very nonspecific. At this point we recommend observation since he is clinically improving. Follow-up immediately for any recurrent fever or other concerns  Follow Up Instructions:  -As above   99441 5-10 99442 11-20 99443 21-30 I did not refer this patient for an OV in the next 24 hours for this/these issue(s).  I discussed the assessment and treatment plan with the patient. The patient was provided an opportunity to ask questions and all were answered. The patient agreed with the plan and demonstrated an understanding of the instructions.   The patient was advised to call back or seek an in-person evaluation if the symptoms worsen or if the condition fails to improve as anticipated.  I provided 16 minutes  of non-face-to-face time during this encounter.   Evelena Peat, MD

## 2020-04-14 ENCOUNTER — Ambulatory Visit (INDEPENDENT_AMBULATORY_CARE_PROVIDER_SITE_OTHER): Payer: BC Managed Care – PPO | Admitting: Medical

## 2020-04-14 VITALS — BP 165/80 | HR 72 | Temp 100.1°F | Resp 16 | Wt 329.0 lb

## 2020-04-14 DIAGNOSIS — R059 Cough, unspecified: Secondary | ICD-10-CM | POA: Diagnosis not present

## 2020-04-14 DIAGNOSIS — G4733 Obstructive sleep apnea (adult) (pediatric): Secondary | ICD-10-CM

## 2020-04-14 DIAGNOSIS — U071 COVID-19: Secondary | ICD-10-CM | POA: Diagnosis not present

## 2020-04-14 DIAGNOSIS — R0602 Shortness of breath: Secondary | ICD-10-CM | POA: Diagnosis not present

## 2020-04-14 DIAGNOSIS — I10 Essential (primary) hypertension: Secondary | ICD-10-CM

## 2020-04-14 MED ORDER — PREDNISONE 10 MG PO TABS: 10.0000 mg | ORAL_TABLET | Freq: Every day | ORAL | 0 refills | Status: DC

## 2020-04-14 MED ORDER — EMERGEN-C IMMUNE PLUS PO PACK: 1.0000 | PACK | Freq: Two times a day (BID) | ORAL | 0 refills | Status: DC

## 2020-04-14 MED ORDER — ALBUTEROL SULFATE HFA 108 (90 BASE) MCG/ACT IN AERS: 2.0000 | INHALATION_SPRAY | Freq: Four times a day (QID) | RESPIRATORY_TRACT | 0 refills | Status: DC | PRN

## 2020-04-14 MED ORDER — BENZONATATE 200 MG PO CAPS: 200.0000 mg | ORAL_CAPSULE | Freq: Three times a day (TID) | ORAL | 0 refills | Status: DC | PRN

## 2020-04-14 MED ORDER — AZITHROMYCIN 250 MG PO TABS: 250.0000 mg | ORAL_TABLET | Freq: Two times a day (BID) | ORAL | 0 refills | Status: DC

## 2020-04-14 NOTE — Progress Notes (Signed)
[facility]   Subjective:  Antonio Watts is a 60 y.o. male who presents for respiratory illness.    PCP: Kristian Covey, MD  Here for recheck on COVID infection.  Some of his comorbidities include obesity, sleep apnea, hypertension.  his symptoms began about a week and a half ago.  Initial symptoms January 9.  He tested positive shortly thereafter at [facility] testing site.  His symptoms have included on and off fevers, fever as high as 100.3, body aches and chills, mild headache, congestion, shallow breathing, some wheezing, fatigue, coughing all night, not sleeping well due to cough.  He denies sore throat, nausea vomiting diarrhea.  He did end up getting infusion therapy this past Saturday 04/08/20.  He had really bad chills for 2 days after infusion.  He currently is using Delsym, DayQuil, aspirin.  He needs something stronger for cough  He denies any prior regular use of albuterol but did have a breathing treatment in the remote past with a respiratory tract infection.  No prior heart failure.  No chest pain or leg swelling.  No recent recent weight fluctuation  He did not get the COVID-vaccine.  No other aggravating or relieving factors.  No other c/o.  Past Medical History:  Diagnosis Date  . Hypertension   . Kidney calculi   . Kidney stones   . Low testosterone   . OSA (obstructive sleep apnea)     ROS as in subjective   Objective: BP (!) 165/80 (BP Location: Left Arm, Patient Position: Sitting, Cuff Size: Large)   Pulse 72   Temp 100.1 F (37.8 C)   Resp 16   Wt (!) 329 lb (149.2 kg)   SpO2 96%   BMI 50.77 kg/m   General appearance: Alert, WD/WN, no distress, mildly ill appearing, obese white male                             Skin: warm, no rash                           Head: no sinus tenderness                            Eyes: conjunctiva normal, corneas clear, PERRLA                            Ears: pearly TMs,  external ear canals normal                          Nose: septum midline, turbinates swollen, with erythema and no discharge             Mouth/throat: MMM, tongue normal, mild pharyngeal erythema                           Neck: supple, no adenopathy, no thyromegaly, non tender                          Heart: RRR, normal S1, S2, no murmurs                         Lungs: Decreased breath sounds with scattered rhonchi, faint  wheezes,, but no rales, or rhonchi No extremity edema, no leg asymmetry or tenderness, negative Homans Pulses upper 2+ and lower 2+extremities       Assessment  Encounter Diagnoses  Name Primary?  . SOB (shortness of breath) Yes  . Cough   . COVID-19 virus infection   . Severe obesity (BMI >= 40) (HCC)   . Primary hypertension   . OSA (obstructive sleep apnea)       Plan: Overall he seems to be gradually improving.  He did not get the vaccine.  He tested positive for COVID.  He did complete infusion therapy.  At this point I will add prednisone to reduce inflammation, albuterol inhaler, vitamin pack and Z-Pak antibiotic which is also for inflammation.  He takes a baby aspirin in general which can help with clot prevention which can be a feature of COVID  I will have him go for x-ray given the lung sounds today  We discussed the following information which was given as a handout  Patient Instructions  Recommendations Go to [facility] long for chest x-ray tomorrow between 8 AM to 4 PM.  Go to the main entrance with the mask on and asked for the radiology department for an x-ray  I sent several medicines to the pharmacy  Prednisone is to reduce inflammation in the lungs  Z-Pak antibiotic is for infection and to reduce inflammation in the lungs  EmergenC is a vitamin pack to help your body cope  Tessalon Perles cough drops you can use 3 times a day for cough  Albuterol inhaler is to open the air sacs in the lungs and help get mucus out.  You can use this 3-4  times per day.  He used to long deep inhalations  Work on getting good fluid intake and rest  If you are much worse over the next 3 to 5 days go to the hospital, or get reevaluated      General recommendations if you have respiratory symptoms: We recommend you rest, hydrate well with water and clear fluids throughout the day such as water, soup broth, ice chips, or possibly pedialyte or G2 no sugar gatorade.   You can use Tylenol over the counter for pain or fever every 4 - 6 hours You can use over the counter Delsym or mucinex DM for cough unless your provider prescribed a cough medication already. You can use over the counter Emetrol over the counter for nausea.    Consider EmergenC Immune plus vitamin pack over the counter which contains extra vitamin C, vitamin D, and zinc.  If you are having trouble breathing, if you are very weak, have high fever 103 or higher consistently despite Tylenol, or uncontrollable nausea and vomiting, then call or go to the emergency department.    Covid symptoms such as fatigue and cough can linger over 2 weeks, even after the initial fever, aches, chills, and other initial symptoms.   Self Quarantine and Isolation: Log onto [employer] for up to date quarantine recommendations.   [email or web address]   If you test Covid +, regardless of vaccination status:  Stay home for 5 days. If you have no symptoms or your symptoms are resolving after 5 days, then you can leave your house. Continue to wear a mask around others for 5 additional days. If you have a fever, continue to stay home until your fever resolves.  This could take 7-10 days from onset of symptoms or longer in some cases. If you have lots  of coughing, sneezing, and significant runny nose and congestion, continue to stay at home until your symptoms are resolving   If you were exposed to someone with Covid: If you: Have been  boosted OR Completed the primary series of Pfizer or Moderna vaccine within the last 6 months OR Completed the primary series of J&J vaccine within the last 2 months  Wear a mask around others for 10 days.  Test on day 5, if possible.   If you test positive on day 5 or more after exposure, and no symptoms, then wear a mask around others for 10 days  If you test positive and have symptoms, then follow the positive covid result isolation recommendations above If you test negative and have no symptoms on day 5 after exposure, then you may end isolation and be around others    If you were exposed to someone with Covid: If you: Completed the primary series of Pfizer or Moderna vaccine over 6 months ago and are not boosted OR Completed the primary series of J&J over 2 months ago and are not boosted OR Are unvaccinated  Stay home for 5 days. After that continue to wear a mask around others for 5 additional days. If you can't quarantine you must wear a mask for 10 days. Test on day 5 if possible. If you test positive on day 5 or more after exposure, and no symptoms, then wear a mask around others for 10 days  If you test positive and have symptoms, then follow the positive covid result isolation recommendations above If you test negative and have no symptoms on day 5 after exposure, then you can end isolation but wear a mask around others for 5 more days   If you test Covid negative, but have respiratory symptoms:  Continue to wear a mask around others for 5 additional days. If you have a fever, continue to stay home until your fever resolves.   If you have lots of coughing, sneezing, and significant runny nose and congestion, continue to stay at home until your symptoms are resolving   Isolation means avoiding contact with people as much as possible.   Particularly in your house, isolate your self from others in a separate room, wear a mask when possible in the room, particularly if  coughing a lot.   Have others bring food, water, medications, etc., to your door, but avoid direct contact with your household contacts during this time to avoid spreading the infection to them.   If you have a separate bathroom and living quarters during the next 2 weeks away from others, that would be preferable.    If you can't completely isolate, then wear a mask, wash hands frequently with soap and water for at least 15 seconds, minimize close contact with others, and have a friend or family member check regularly from a distance to make sure you are not getting seriously worse.     You should not be going out in public, should not be going to stores, to work or other public places until all your symptoms have resolved.  One of the goals is to limit spread to high risk people; people that are older and elderly, people with multiple health issues like diabetes, heart disease, lung disease, and anybody that has weakened immune systems such as people with cancer or on immunosuppressive therapy.   Antonio Watts was seen today for covid positive, dizziness, cough and shortness of breath.  Diagnoses and all orders for this visit:  SOB (shortness of breath) -     DG Chest 2 View; Future  Cough -     DG Chest 2 View; Future  COVID-19 virus infection -     DG Chest 2 View; Future  Severe obesity (BMI >= 40) (HCC)  Primary hypertension  OSA (obstructive sleep apnea)  Other orders -     albuterol (VENTOLIN HFA) 108 (90 Base) MCG/ACT inhaler; Inhale 2 puffs into the lungs every 6 (six) hours as needed for wheezing or shortness of breath. -     benzonatate (TESSALON) 200 MG capsule; Take 1 capsule (200 mg total) by mouth 3 (three) times daily as needed for cough. -     predniSONE (DELTASONE) 10 MG tablet; Take 1 tablet (10 mg total) by mouth daily with breakfast. 6/5/4/3/2/1 taper -     azithromycin (ZITHROMAX) 250 MG tablet; Take 1 tablet (250 mg total) by mouth in the morning and at bedtime. -      Multiple Vitamins-Minerals (EMERGEN-C IMMUNE PLUS) PACK; Take 1 tablet by mouth 2 (two) times daily.     Patient voiced understanding of diagnosis, recommendations, and treatment plan.  After visit summary given.

## 2020-04-14 NOTE — Patient Instructions (Addendum)
Recommendations Go to [facility] long for chest x-ray tomorrow between 8 AM to 4 PM.  Go to the main entrance with the mask on and asked for the radiology department for an x-ray  I sent several medicines to the pharmacy  Prednisone is to reduce inflammation in the lungs  Z-Pak antibiotic is for infection and to reduce inflammation in the lungs  EmergenC is a vitamin pack to help your body cope  Tessalon Perles cough drops you can use 3 times a day for cough  Albuterol inhaler is to open the air sacs in the lungs and help get mucus out.  You can use this 3-4 times per day.  He used to long deep inhalations  Work on getting good fluid intake and rest  If you are much worse over the next 3 to 5 days go to the hospital, or get reevaluated

## 2020-04-15 ENCOUNTER — Encounter (HOSPITAL_COMMUNITY): Payer: Self-pay

## 2020-04-15 ENCOUNTER — Emergency Department (HOSPITAL_COMMUNITY)
Admission: EM | Admit: 2020-04-15 | Discharge: 2020-04-15 | Disposition: A | Discharge status: Home / Self Care | Payer: BC Managed Care – PPO | Attending: Emergency Medicine | Admitting: Emergency Medicine

## 2020-04-15 ENCOUNTER — Emergency Department (HOSPITAL_COMMUNITY): Payer: BC Managed Care – PPO

## 2020-04-15 ENCOUNTER — Other Ambulatory Visit: Payer: Self-pay

## 2020-04-15 DIAGNOSIS — R059 Cough, unspecified: Secondary | ICD-10-CM

## 2020-04-15 DIAGNOSIS — Z5321 Procedure and treatment not carried out due to patient leaving prior to being seen by health care provider: Secondary | ICD-10-CM | POA: Insufficient documentation

## 2020-04-15 DIAGNOSIS — U071 COVID-19: Secondary | ICD-10-CM | POA: Diagnosis not present

## 2020-04-15 NOTE — ED Triage Notes (Signed)
Patient states he was Covid + 11 days ago. Patient continues to have chest congestion and cough. Patient was started on prednisone, Tessalon perles, Albuterol and Z-Pack. Patient took Prednisone last night and z-pack he started today. patient stated he was sent to the ED for a chest x-ray.

## 2020-04-24 ENCOUNTER — Encounter: Payer: Self-pay | Admitting: Pulmonary Disease

## 2020-04-24 ENCOUNTER — Ambulatory Visit (INDEPENDENT_AMBULATORY_CARE_PROVIDER_SITE_OTHER): Payer: BC Managed Care – PPO | Admitting: Pulmonary Disease

## 2020-04-24 ENCOUNTER — Other Ambulatory Visit: Payer: Self-pay

## 2020-04-24 VITALS — BP 146/86 | HR 64 | Temp 97.9°F | Ht 69.0 in | Wt 333.2 lb

## 2020-04-24 DIAGNOSIS — G4733 Obstructive sleep apnea (adult) (pediatric): Secondary | ICD-10-CM | POA: Diagnosis not present

## 2020-04-24 NOTE — Progress Notes (Signed)
Antonio Watts    [id number]    [birth year]  Primary Care Physician:Burchette, Elberta Fortis, MD  Referring Physician: Kristian Covey, MD [address] [place],  [place] [address]  Chief complaint:   Patient with a history of obstructive sleep apnea Compliant with CPAP use  HPI:  Obstructive sleep apnea was diagnosed many years ago Has been using CPAP consistently Current machine is at least 28 to 61 years old and machine showing warnings of being beyond the warranty  Uses CPAP nightly Gets about 8 hours of sleep Sleep time varies 3-4 awakenings Wakes up feeling rested  Admits to dryness of his mouth sometimes in the mornings No headaches No family history of obstructive sleep apnea About 15 to 20 pound weight gain over the last 10 years  Functioning well  Reformed smoker   Outpatient Encounter Medications as of 04/24/2020  Medication Sig  . albuterol (VENTOLIN HFA) 108 (90 Base) MCG/ACT inhaler Inhale 2 puffs into the lungs every 6 (six) hours as needed for wheezing or shortness of breath.  Marland Kitchen azithromycin (ZITHROMAX) 250 MG tablet Take 1 tablet (250 mg total) by mouth in the morning and at bedtime.  Marland Kitchen b complex vitamins tablet Take 1 tablet by mouth daily.  . benzonatate (TESSALON) 200 MG capsule Take 1 capsule (200 mg total) by mouth 3 (three) times daily as needed for cough.  . cyclobenzaprine (FLEXERIL) 10 MG tablet Take 1 tablet (10 mg total) by mouth 3 (three) times daily as needed for muscle spasms.  Marland Kitchen gabapentin (NEURONTIN) 300 MG capsule TAKE 1 CAPSULE(300 MG) BY MOUTH THREE TIMES DAILY  . lisinopril (ZESTRIL) 10 MG tablet TAKE 1 TABLET(10 MG) BY MOUTH DAILY  . MELATONIN PO Take by mouth.  . meloxicam (MOBIC) 15 MG tablet TAKE 1 TABLET(15 MG) BY MOUTH DAILY  . Multiple Vitamin (MULTIVITAMIN WITH MINERALS) TABS tablet Take 1 tablet by mouth daily.  . Multiple Vitamins-Minerals (EMERGEN-C IMMUNE PLUS) PACK Take 1 tablet by mouth 2 (two) times daily.   . NON FORMULARY CPAP at bedtime  . ondansetron (ZOFRAN ODT) 4 MG disintegrating tablet Allow 1-2 tablets to dissolve in your mouth every 8 hours as needed for nausea/vomiting (Patient not taking: No sig reported)  . predniSONE (DELTASONE) 10 MG tablet Take 1 tablet (10 mg total) by mouth daily with breakfast. 6/5/4/3/2/1 taper (Patient not taking: Reported on 04/24/2020)   No facility-administered encounter medications on file as of 04/24/2020.    Allergies as of 04/24/2020 - Review Complete 04/24/2020  Allergen Reaction Noted  . Doxycycline hyclate  11/14/2009    Past Medical History:  Diagnosis Date  . Hypertension   . Kidney calculi   . Kidney stones   . Low testosterone   . OSA (obstructive sleep apnea)     No past surgical history on file.  Family History  Problem Relation Age of Onset  . Cancer Other        fhx  . Heart disease Other        fhx  . Heart failure Other   . Pulmonary fibrosis Mother     Social History   Socioeconomic History  . Marital status: Married    Spouse name: Not on file  . Number of children: Not on file  . Years of education: Not on file  . Highest education level: Not on file  Occupational History  . Not on file  Tobacco Use  . Smoking status: Former Smoker  Packs/day: 1.00    Years: 35.00    Pack years: 35.00    Types: Cigarettes    Quit date: 05/26/2011    Years since quitting: 8.9  . Smokeless tobacco: Never Used  Vaping Use  . Vaping Use: Never used  Substance and Sexual Activity  . Alcohol use: No  . Drug use: No  . Sexual activity: Not on file  Other Topics Concern  . Not on file  Social History Narrative  . Not on file   Social Determinants of Health   Financial Resource Strain: Not on file  Food Insecurity: Not on file  Transportation Needs: Not on file  Physical Activity: Not on file  Stress: Not on file  Social Connections: Not on file  Intimate Partner Violence: Not on file    Review of Systems   Constitutional: Negative for fatigue.  Respiratory: Positive for apnea. Negative for shortness of breath.   Psychiatric/Behavioral: Positive for sleep disturbance.    Vitals:   04/24/20 1028  BP: (!) 146/86  Pulse: 64  Temp: 97.9 F (36.6 C)  SpO2: 98%     Physical Exam Constitutional:      Appearance: He is obese.  HENT:     Head: Normocephalic and atraumatic.     Mouth/Throat:     Mouth: Mucous membranes are moist.  Eyes:     General:        Right eye: No discharge.     Pupils: Pupils are equal, round, and reactive to light.  Cardiovascular:     Rate and Rhythm: Normal rate and regular rhythm.     Heart sounds: No murmur heard. No friction rub.  Pulmonary:     Effort: No respiratory distress.     Breath sounds: No stridor. No wheezing or rhonchi.  Musculoskeletal:     Cervical back: No rigidity or tenderness.  Neurological:     Mental Status: He is alert.  Psychiatric:        Mood and Affect: Mood normal.    Epworth of 9  Assessment:  Obstructive sleep apnea  Current machine is dated  Pathophysiology of sleep apnea discussed with the patient Treatment options discussed  Plan/Recommendations: Schedule patient for a home sleep study  Prescription will be sent to [facility] for CPAP supplies, new mask  Tentative follow-up in 6 months  Encouraged to call with any significant concerns   Virl Diamond MD [facility] 04/24/2020, 10:48 AM  CC: Kristian Covey, MD

## 2020-04-24 NOTE — Patient Instructions (Signed)
I will follow-up with you in 6 months  Schedule for home sleep study  Prescription to [facility] for CPAP supplies -Trial with a new mask/headgear  Call with significant concerns Continue using current CPAP

## 2020-04-27 DIAGNOSIS — G4733 Obstructive sleep apnea (adult) (pediatric): Secondary | ICD-10-CM | POA: Diagnosis not present

## 2020-05-05 ENCOUNTER — Other Ambulatory Visit: Payer: Self-pay

## 2020-05-05 ENCOUNTER — Ambulatory Visit: Payer: BC Managed Care – PPO

## 2020-05-05 DIAGNOSIS — G4733 Obstructive sleep apnea (adult) (pediatric): Secondary | ICD-10-CM

## 2020-05-11 DIAGNOSIS — G4733 Obstructive sleep apnea (adult) (pediatric): Secondary | ICD-10-CM | POA: Diagnosis not present

## 2020-05-12 ENCOUNTER — Telehealth: Payer: Self-pay | Admitting: Pulmonary Disease

## 2020-05-12 DIAGNOSIS — G4733 Obstructive sleep apnea (adult) (pediatric): Secondary | ICD-10-CM

## 2020-05-12 NOTE — Telephone Encounter (Signed)
Called and spoke with patient to let him know of sleep study results. Advised that Dr. Wynona Neat is recommending CPAP. Patient stated that he already has a CPAP machine but it says motor has exceeded life expectancy. Order has been placed for new CPAP machine. Patient would also like to switch DME companies. States he used to live in [place] and now lives in [place] and would like something in [place] or closer to him. Nothing further needed at this time.

## 2020-05-12 NOTE — Telephone Encounter (Signed)
Call patient  Sleep study result  Date of study: 05/07/2020  Impression: Moderate obstructive sleep apnea No significant oxygen desaturations  Recommendation: DME referral  Recommend CPAP therapy for moderate obstructive sleep apnea  Auto titrating CPAP with pressure settings of 5-15 will be appropriate  Encourage weight loss measures  Follow-up in the office 4 to 6 weeks following initiation of treatment

## 2020-06-08 ENCOUNTER — Other Ambulatory Visit: Payer: Self-pay

## 2020-06-09 ENCOUNTER — Encounter: Payer: Self-pay | Admitting: Family Medicine

## 2020-06-09 ENCOUNTER — Ambulatory Visit: Payer: BC Managed Care – PPO | Admitting: Family Medicine

## 2020-06-09 VITALS — BP 132/80 | HR 64 | Temp 98.2°F | Ht 69.0 in | Wt 329.2 lb

## 2020-06-09 DIAGNOSIS — L989 Disorder of the skin and subcutaneous tissue, unspecified: Secondary | ICD-10-CM

## 2020-06-09 DIAGNOSIS — Z1211 Encounter for screening for malignant neoplasm of colon: Secondary | ICD-10-CM | POA: Diagnosis not present

## 2020-06-09 NOTE — Progress Notes (Signed)
Established Patient Office Visit  Subjective:  Patient ID: Antonio Watts, male    DOB: 1960-04-01  Age: 60 y.o. MRN: [id number]  CC:  Chief Complaint  Patient presents with  . knot on nose    X 6-8 months, has gotten bigger.     HPI Brighton Pilley Mattia presents for skin growth at the left naris about 6 to 8 months ago.  Mild discomfort at times.  No bleeding.  Slowly growing in size.  No past history of skin cancer.  Denies any appetite or weight changes.  No neck adenopathy.  Patient is due for repeat colonoscopy.  Last colonoscopy was 2011.  He is willing to go at this time.  No recent change in stool habits.  Past Medical History:  Diagnosis Date  . Hypertension   . Kidney calculi   . Kidney stones   . Low testosterone   . OSA (obstructive sleep apnea)     History reviewed. No pertinent surgical history.  Family History  Problem Relation Age of Onset  . Cancer Other        fhx  . Heart disease Other        fhx  . Heart failure Other   . Pulmonary fibrosis Mother     Social History   Socioeconomic History  . Marital status: Married    Spouse name: Not on file  . Number of children: Not on file  . Years of education: Not on file  . Highest education level: Not on file  Occupational History  . Not on file  Tobacco Use  . Smoking status: Former Smoker    Packs/day: 1.00    Years: 35.00    Pack years: 35.00    Types: Cigarettes    Quit date: 05/26/2011    Years since quitting: 9.0  . Smokeless tobacco: Never Used  Vaping Use  . Vaping Use: Never used  Substance and Sexual Activity  . Alcohol use: No  . Drug use: No  . Sexual activity: Not on file  Other Topics Concern  . Not on file  Social History Narrative  . Not on file   Social Determinants of Health   Financial Resource Strain: Not on file  Food Insecurity: Not on file  Transportation Needs: Not on file  Physical Activity: Not on file  Stress: Not on file  Social Connections: Not on file   Intimate Partner Violence: Not on file    Outpatient Medications Prior to Visit  Medication Sig Dispense Refill  . albuterol (VENTOLIN HFA) 108 (90 Base) MCG/ACT inhaler Inhale 2 puffs into the lungs every 6 (six) hours as needed for wheezing or shortness of breath. 18 g 0  . azithromycin (ZITHROMAX) 250 MG tablet Take 1 tablet (250 mg total) by mouth in the morning and at bedtime. 10 tablet 0  . b complex vitamins tablet Take 1 tablet by mouth daily.    . benzonatate (TESSALON) 200 MG capsule Take 1 capsule (200 mg total) by mouth 3 (three) times daily as needed for cough. 30 capsule 0  . cyclobenzaprine (FLEXERIL) 10 MG tablet Take 1 tablet (10 mg total) by mouth 3 (three) times daily as needed for muscle spasms. 30 tablet 0  . gabapentin (NEURONTIN) 300 MG capsule TAKE 1 CAPSULE(300 MG) BY MOUTH THREE TIMES DAILY 90 capsule 3  . lisinopril (ZESTRIL) 10 MG tablet TAKE 1 TABLET(10 MG) BY MOUTH DAILY 90 tablet 2  . MELATONIN PO Take by mouth.    Marland Kitchen  meloxicam (MOBIC) 15 MG tablet TAKE 1 TABLET(15 MG) BY MOUTH DAILY 30 tablet 3  . Multiple Vitamin (MULTIVITAMIN WITH MINERALS) TABS tablet Take 1 tablet by mouth daily.    . Multiple Vitamins-Minerals (EMERGEN-C IMMUNE PLUS) PACK Take 1 tablet by mouth 2 (two) times daily. 10 each 0  . NON FORMULARY CPAP at bedtime    . ondansetron (ZOFRAN ODT) 4 MG disintegrating tablet Allow 1-2 tablets to dissolve in your mouth every 8 hours as needed for nausea/vomiting 30 tablet 0  . predniSONE (DELTASONE) 10 MG tablet Take 1 tablet (10 mg total) by mouth daily with breakfast. 6/5/4/3/2/1 taper 21 tablet 0   No facility-administered medications prior to visit.    Allergies  Allergen Reactions  . Doxycycline Hyclate     REACTION: rash, swelling    ROS Review of Systems  Constitutional: Negative for appetite change and unexpected weight change.  Respiratory: Negative for shortness of breath.   Hematological: Negative for adenopathy.       Objective:    Physical Exam Vitals reviewed.  HENT:     Nose:     Comments: Patient has a small skin lesion left naris along the inferior portion which has fleshy base which is probably about 2 to 3 mm with at the base.  Slightly hyperkeratotic surface. Cardiovascular:     Rate and Rhythm: Normal rate and regular rhythm.  Musculoskeletal:     Cervical back: Neck supple.  Lymphadenopathy:     Cervical: No cervical adenopathy.  Neurological:     Mental Status: He is alert.     BP 132/80   Pulse 64   Temp 98.2 F (36.8 C) (Oral)   Ht 5\' 9"  (1.753 m)   Wt (!) 329 lb 3 oz (149.3 kg)   SpO2 97%   BMI 48.61 kg/m  Wt Readings from Last 3 Encounters:  06/09/20 (!) 329 lb 3 oz (149.3 kg)  04/24/20 (!) 333 lb 3.2 oz (151.1 kg)  04/14/20 (!) 329 lb (149.2 kg)     Health Maintenance Due  Topic Date Due  . COVID-19 Vaccine (1) Never done  . HIV Screening  Never done  . COLONOSCOPY (Pts 45-12yrs Insurance coverage will need to be confirmed)  09/19/2019    There are no preventive care reminders to display for this patient.  Lab Results  Component Value Date   TSH 2.19 08/20/2019   Lab Results  Component Value Date   WBC 6.8 08/20/2019   HGB 14.2 08/20/2019   HCT 43.9 08/20/2019   MCV 75.8 (L) 08/20/2019   PLT 223.0 08/20/2019   Lab Results  Component Value Date   NA 142 08/20/2019   K 4.5 08/20/2019   CO2 29 08/20/2019   GLUCOSE 95 08/20/2019   BUN 16 08/20/2019   CREATININE 1.03 08/20/2019   BILITOT 1.0 08/20/2019   ALKPHOS 72 08/20/2019   AST 24 08/20/2019   ALT 30 08/20/2019   PROT 7.2 08/20/2019   ALBUMIN 4.4 08/20/2019   CALCIUM 10.3 08/20/2019   ANIONGAP 7 07/29/2019   GFR 73.63 08/20/2019   Lab Results  Component Value Date   CHOL 185 08/20/2019   Lab Results  Component Value Date   HDL 33.10 (L) 08/20/2019   Lab Results  Component Value Date   LDLCALC 123 (H) 08/20/2019   Lab Results  Component Value Date   TRIG 146.0 08/20/2019   Lab  Results  Component Value Date   CHOLHDL 6 08/20/2019   Lab Results  Component Value Date   HGBA1C  09/27/2008    5.6 (NOTE) The ADA recommends the following therapeutic goal for glycemic control related to Hgb A1c measurement: Goal of therapy: <6.5 Hgb A1c  Reference: American Diabetes Association: Clinical Practice Recommendations 2010, Diabetes Care, 2010, 33: (Suppl  1).      Assessment & Plan:   Problem List Items Addressed This Visit   None   Visit Diagnoses    Skin lesion    -  Primary   Relevant Orders   Ambulatory referral to Dermatology   Colon cancer screening       Relevant Orders   Ambulatory referral to Gastroenterology    Patient small skin lesion left naris.  Rule out squamous cell carcinoma in situ. -Setting up referral to skin surgery center for further evaluation  -Set up repeat colonoscopy  No orders of the defined types were placed in this encounter.   Follow-up: No follow-ups on file.    Evelena Peat, MD

## 2020-06-09 NOTE — Patient Instructions (Signed)
I will set up referral for the colonoscopy and dermatologist.

## 2020-06-26 DIAGNOSIS — D485 Neoplasm of uncertain behavior of skin: Secondary | ICD-10-CM | POA: Diagnosis not present

## 2020-06-26 DIAGNOSIS — B079 Viral wart, unspecified: Secondary | ICD-10-CM | POA: Diagnosis not present

## 2020-06-29 ENCOUNTER — Other Ambulatory Visit: Payer: Self-pay

## 2020-06-29 ENCOUNTER — Emergency Department
Admission: EM | Admit: 2020-06-29 | Discharge: 2020-06-29 | Disposition: A | Discharge status: Home / Self Care | Payer: BC Managed Care – PPO | Attending: Emergency Medicine | Admitting: Emergency Medicine

## 2020-06-29 ENCOUNTER — Telehealth (INDEPENDENT_AMBULATORY_CARE_PROVIDER_SITE_OTHER): Payer: BC Managed Care – PPO | Admitting: Family Medicine

## 2020-06-29 DIAGNOSIS — R0981 Nasal congestion: Secondary | ICD-10-CM

## 2020-06-29 DIAGNOSIS — R55 Syncope and collapse: Secondary | ICD-10-CM | POA: Insufficient documentation

## 2020-06-29 DIAGNOSIS — Z87891 Personal history of nicotine dependence: Secondary | ICD-10-CM | POA: Diagnosis not present

## 2020-06-29 DIAGNOSIS — I10 Essential (primary) hypertension: Secondary | ICD-10-CM | POA: Insufficient documentation

## 2020-06-29 DIAGNOSIS — R519 Headache, unspecified: Secondary | ICD-10-CM | POA: Diagnosis not present

## 2020-06-29 DIAGNOSIS — Z79899 Other long term (current) drug therapy: Secondary | ICD-10-CM | POA: Diagnosis not present

## 2020-06-29 DIAGNOSIS — R42 Dizziness and giddiness: Secondary | ICD-10-CM | POA: Diagnosis not present

## 2020-06-29 DIAGNOSIS — R059 Cough, unspecified: Secondary | ICD-10-CM | POA: Diagnosis not present

## 2020-06-29 HISTORY — DX: Syncope and collapse: R55

## 2020-06-29 LAB — CBC WITH DIFFERENTIAL/PLATELET
Abs Immature Granulocytes: 0.03 10*3/uL (ref 0.00–0.07)
Basophils Absolute: 0.1 10*3/uL (ref 0.0–0.1)
Basophils Relative: 1 %
Eosinophils Absolute: 0.6 10*3/uL — ABNORMAL HIGH (ref 0.0–0.5)
Eosinophils Relative: 6 %
HCT: 44.3 % (ref 39.0–52.0)
Hemoglobin: 14.5 g/dL (ref 13.0–17.0)
Immature Granulocytes: 0 %
Lymphocytes Relative: 27 %
Lymphs Abs: 2.5 10*3/uL (ref 0.7–4.0)
MCH: 24.9 pg — ABNORMAL LOW (ref 26.0–34.0)
MCHC: 32.7 g/dL (ref 30.0–36.0)
MCV: 76.1 fL — ABNORMAL LOW (ref 80.0–100.0)
Monocytes Absolute: 0.7 10*3/uL (ref 0.1–1.0)
Monocytes Relative: 7 %
Neutro Abs: 5.4 10*3/uL (ref 1.7–7.7)
Neutrophils Relative %: 59 %
Platelets: 221 10*3/uL (ref 150–400)
RBC: 5.82 MIL/uL — ABNORMAL HIGH (ref 4.22–5.81)
RDW: 16.8 % — ABNORMAL HIGH (ref 11.5–15.5)
WBC: 9.1 10*3/uL (ref 4.0–10.5)
nRBC: 0 % (ref 0.0–0.2)

## 2020-06-29 LAB — BASIC METABOLIC PANEL
Anion gap: 7 (ref 5–15)
BUN: 15 mg/dL (ref 8–23)
CO2: 25 mmol/L (ref 22–32)
Calcium: 9.7 mg/dL (ref 8.9–10.3)
Chloride: 108 mmol/L (ref 98–111)
Creatinine, Ser: 0.97 mg/dL (ref 0.61–1.24)
GFR, Estimated: >60 mL/min (ref >60)
Glucose, Bld: 117 mg/dL — ABNORMAL HIGH (ref 70–99)
Potassium: 4.3 mmol/L (ref 3.5–5.1)
Sodium: 140 mmol/L (ref 135–145)

## 2020-06-29 LAB — MAGNESIUM: Magnesium: 2.4 mg/dL (ref 1.7–2.4)

## 2020-06-29 MED ORDER — AZITHROMYCIN 250 MG PO TABS: ORAL_TABLET | ORAL | 0 refills | Status: DC

## 2020-06-29 MED ORDER — BENZONATATE 100 MG PO CAPS: 100.0000 mg | ORAL_CAPSULE | Freq: Three times a day (TID) | ORAL | 0 refills | Status: DC | PRN

## 2020-06-29 NOTE — ED Triage Notes (Signed)
Pt states he had a syncopal episode this morning where he "blacked out" for a second and fell- pt states he has tried to get in touch with his doctor for medication for a sinus infection

## 2020-06-29 NOTE — ED Provider Notes (Signed)
[facility] Emergency Department Provider Note  ____________________________________________  Time seen: Approximately 11:34 AM  I have reviewed the triage vital signs and the nursing notes.   HISTORY  Chief Complaint Loss of Consciousness    HPI Antonio Watts is a 60 y.o. male with a history of hypertension, sleep apnea, obesity who comes to ED after passing out at home.  Reports that he had just stood up to walk into the kitchen, felt his vision darkening and getting lightheaded which has happened many times before with standing, but today instead of the feeling of passing he lost consciousness and fell.  Denies any headache or neck pain, no chest pain or shortness of breath, no back pain or abdominal pain.  Has been in his usual state of health prior to this, eating and drinking normally, no vomiting or diarrhea, no recent illness.  After waking up he feels back to normal, no persistent symptoms.      Past Medical History:  Diagnosis Date  . Hypertension   . Kidney calculi   . Kidney stones   . Low testosterone   . OSA (obstructive sleep apnea)      Patient Active Problem List   Diagnosis Date Noted  . Severe obesity (BMI >= 40) (HCC) 08/19/2013  . Hypertension 03/30/2012  . Low testosterone 01/29/2012  . OSA (obstructive sleep apnea) 01/15/2012  . Cellulitis and abscess 09/29/2010  . NEPHROLITHIASIS 01/23/2009  . KERATOSIS 01/23/2009  . PERS HX TOBACCO USE PRESENTING HAZARDS HEALTH 01/23/2009     History reviewed. No pertinent surgical history.   Prior to Admission medications   Medication Sig Start Date End Date Taking? Authorizing Provider  albuterol (VENTOLIN HFA) 108 (90 Base) MCG/ACT inhaler Inhale 2 puffs into the lungs every 6 (six) hours as needed for wheezing or shortness of breath. 04/14/20   Tysinger, Kermit Balo, PA-C  azithromycin (ZITHROMAX) 250 MG tablet Take 1 tablet (250 mg total) by mouth in the morning and at bedtime.  04/14/20   Tysinger, Kermit Balo, PA-C  b complex vitamins tablet Take 1 tablet by mouth daily.    [provider]  benzonatate (TESSALON) 200 MG capsule Take 1 capsule (200 mg total) by mouth 3 (three) times daily as needed for cough. 04/14/20   Tysinger, Kermit Balo, PA-C  cyclobenzaprine (FLEXERIL) 10 MG tablet Take 1 tablet (10 mg total) by mouth 3 (three) times daily as needed for muscle spasms. 09/08/19   Burchette, Elberta Fortis, MD  gabapentin (NEURONTIN) 300 MG capsule TAKE 1 CAPSULE(300 MG) BY MOUTH THREE TIMES DAILY 04/03/20   Burchette, Elberta Fortis, MD  lisinopril (ZESTRIL) 10 MG tablet TAKE 1 TABLET(10 MG) BY MOUTH DAILY 04/03/20   Burchette, Elberta Fortis, MD  MELATONIN PO Take by mouth.    [provider]  meloxicam (MOBIC) 15 MG tablet TAKE 1 TABLET(15 MG) BY MOUTH DAILY 04/03/20   Hyatt, Max T, DPM  Multiple Vitamin (MULTIVITAMIN WITH MINERALS) TABS tablet Take 1 tablet by mouth daily.    [provider]  Multiple Vitamins-Minerals (EMERGEN-C IMMUNE PLUS) PACK Take 1 tablet by mouth 2 (two) times daily. 04/14/20   Tysinger, Kermit Balo, PA-C  NON FORMULARY CPAP at bedtime    [provider]  ondansetron (ZOFRAN ODT) 4 MG disintegrating tablet Allow 1-2 tablets to dissolve in your mouth every 8 hours as needed for nausea/vomiting 07/29/19   Loleta Rose, MD  predniSONE (DELTASONE) 10 MG tablet Take 1 tablet (10 mg total) by mouth daily with  breakfast. 6/5/4/3/2/1 taper 04/14/20   Tysinger, Kermit Balo, PA-C     Allergies Doxycycline hyclate   Family History  Problem Relation Age of Onset  . Cancer Other        fhx  . Heart disease Other        fhx  . Heart failure Other   . Pulmonary fibrosis Mother     Social History Social History   Tobacco Use  . Smoking status: Former Smoker    Packs/day: 1.00    Years: 35.00    Pack years: 35.00    Types: Cigarettes    Quit date: 05/26/2011    Years since quitting: 9.1  . Smokeless tobacco: Never Used  Vaping Use  . Vaping  Use: Never used  Substance Use Topics  . Alcohol use: No  . Drug use: No    Review of Systems  Constitutional:   No fever or chills.  ENT:   No sore throat. No rhinorrhea. Cardiovascular:   No chest pain positive syncope. Respiratory:   No dyspnea or cough. Gastrointestinal:   Negative for abdominal pain, vomiting and diarrhea.  Musculoskeletal:   Negative for focal pain or swelling All other systems reviewed and are negative except as documented above in ROS and HPI.  ____________________________________________   PHYSICAL EXAM:  VITAL SIGNS: ED Triage Vitals  Enc Vitals Group     BP 06/29/20 0858 (!) 147/82     Pulse Rate 06/29/20 0858 64     Resp 06/29/20 0858 20     Temp 06/29/20 0858 98.2 F (36.8 C)     Temp Source 06/29/20 0858 Oral     SpO2 06/29/20 0858 97 %     Weight 06/29/20 0855 (!) 330 lb (149.7 kg)     Height 06/29/20 0855 5\' 9"  (1.753 m)     Head Circumference --      Peak Flow --      Pain Score 06/29/20 0855 0     Pain Loc --      Pain Edu? --      Excl. in GC? --     Vital signs reviewed, nursing assessments reviewed.   Constitutional:   Alert and oriented. Non-toxic appearance. Eyes:   Conjunctivae are normal. EOMI. PERRL. ENT      Head:   Normocephalic and atraumatic.      Nose:   Wearing a mask.      Mouth/Throat:   Wearing a mask.      Neck:   No meningismus. Full ROM. Hematological/Lymphatic/Immunilogical:   No cervical lymphadenopathy. Cardiovascular:   RRR. Symmetric bilateral radial and DP pulses.  No murmurs. Cap refill less than 2 seconds. Respiratory:   Normal respiratory effort without tachypnea/retractions. Breath sounds are clear and equal bilaterally. No wheezes/rales/rhonchi. Gastrointestinal:   Soft and nontender. Non distended. There is no CVA tenderness.  No rebound, rigidity, or guarding. Genitourinary:   deferred Musculoskeletal:   Normal range of motion in all extremities. No joint effusions.  No lower extremity  tenderness.  No edema. Neurologic:   Normal speech and language.  Motor grossly intact. No acute focal neurologic deficits are appreciated.  Skin:    Skin is warm, dry and intact. No rash noted.  No petechiae, purpura, or bullae.  ____________________________________________    LABS (pertinent positives/negatives) (all labs ordered are listed, but only abnormal results are displayed) Labs Reviewed  BASIC METABOLIC PANEL - Abnormal; Notable for the following components:      Result Value  Glucose, Bld 117 (*)    All other components within normal limits  CBC WITH DIFFERENTIAL/PLATELET - Abnormal; Notable for the following components:   RBC 5.82 (*)    MCV 76.1 (*)    MCH 24.9 (*)    RDW 16.8 (*)    Eosinophils Absolute 0.6 (*)    All other components within normal limits  MAGNESIUM   ____________________________________________   EKG  Interpreted by me Normal sinus rhythm rate of 66, normal axis and intervals.  Normal QRS ST segments and T waves.  No ischemic changes or evidence of dysrhythmia or heart block.  ____________________________________________    RADIOLOGY  No results found.  ____________________________________________   PROCEDURES Procedures  ____________________________________________  DIFFERENTIAL DIAGNOSIS   Dehydration, electrolyte abnormality, dysrhythmia, orthostatic dizziness  CLINICAL IMPRESSION / ASSESSMENT AND PLAN / ED COURSE  Medications ordered in the ED: Medications - No data to display  Pertinent labs & imaging results that were available during my care of the patient were reviewed by me and considered in my medical decision making (see chart for details).  Antonio Watts was evaluated in Emergency Department on 06/29/2020 for the symptoms described in the history of present illness. He was evaluated in the context of the global COVID-19 pandemic, which necessitated consideration that the patient might be at risk for infection  with the SARS-CoV-2 virus that causes COVID-19. Institutional protocols and algorithms that pertain to the evaluation of patients at risk for COVID-19 are in a state of rapid change based on information released by regulatory bodies including the CDC and federal and state organizations. These policies and algorithms were followed during the patient's care in the ED.   Patient presents with episode of syncope after standing up and feeling lightheaded.  No red flag symptoms or worrisome prodrome.  Otherwise in his baseline state of health, feels normal now.  Vital signs are normal, nontoxic.  Exam is reassuring.  EKG unremarkable.  Lab panel normal.  Stable for discharge home.      ____________________________________________   FINAL CLINICAL IMPRESSION(S) / ED DIAGNOSES    Final diagnoses:  Syncope, unspecified syncope type  Orthostatic dizziness     ED Discharge Orders    None      Portions of this note were generated with dragon dictation software. Dictation errors may occur despite best attempts at proofreading.   Sharman Cheek, MD 06/29/20 [phone]

## 2020-06-29 NOTE — ED Notes (Signed)
Patient is resting comfortably. Fall precautions in place. Call light in reach.  

## 2020-06-29 NOTE — Patient Instructions (Addendum)
   ---------------------------------------------------------------------------------------------------------------------------      WORK SLIP:  Patient Antonio Watts,  07-03-1960, was seen for a medical visit today, 06/29/20 . Please excuse from work for a  illness. If the covid test is positive, we advise 10 days minimum from the onset of symptoms (06/26/20) PLUS 1 day of no fever and improved symptoms. Will defer to employer for a sooner return to work if the covid test is negative and symptoms have resolved, OR it is greater than 5 days since the positive test, symptoms have resolved and the patient can wear a high-quality, tight fitting mask such as N95 or KN95 at all times for an additional 5 days. Would also suggest COVID19 antigen testing is negative prior to return.  Sincerely: E-signature: Dr. Kriste Basque, DO  [facility] Ph: [phone]   ------------------------------------------------------------------------------------------------------------------------------    HOME CARE TIPS:  [facility] COVID19 testing information: [email or web address] [place] [phone] Most pharmacies also offer testing and home test kits. If you have a positive covid test and you are interested in treatment, please schedule a follow up video visit promptly with your primary care doctor or with [facility]  -I sent the medication(s) we discussed to your pharmacy: Meds ordered this encounter  Medications  . azithromycin (ZITHROMAX) 250 MG tablet    Sig: 2 tabs day 1, then one tab daily    Dispense:  6 tablet    Refill:  0  . benzonatate (TESSALON PERLES) 100 MG capsule    Sig: Take 1 capsule (100 mg total) by mouth 3 (three) times daily as needed.    Dispense:  20 capsule    Refill:  0    -Please take your allergy medication daily, also could consider Flonase  -Nasal saline twice daily  -If these measures are not working or symptoms  are worsening please take the azithromycin.  -can use tylenol or aleve if needed for fevers, aches and pains per instructions  -stay hydrated,  eat small healthy meals - avoid dairy  -can take 1000 IU ( ) Vit D3 and 100-500 mg of Vit C daily per instructions  -If the Covid test is positive, check out the CDC website for more information on home care, transmission and treatment for COVID19  -follow up with your doctor in 2-3 days unless improving and feeling better  -stay home while sick, except to seek medical care, and if you have COVID19 ideally it would be best to stay home for a full 10 days since the onset of symptoms PLUS one day of no fever and feeling better. Wear a good mask (such as N95 or KN95) if around others to reduce the risk of transmission.  It was nice to meet you today, and I really hope you are feeling better soon. I help  out with telemedicine visits on Tuesdays and Thursdays and am available for visits on those days. If you have any concerns or questions following this visit please schedule a follow up visit with your Primary Care doctor or seek care at a local urgent care clinic to avoid delays in care.    Seek in person care or schedule a follow up video visit promptly if your symptoms worsen, new concerns arise or you are not improving with treatment. Call 911 and/or seek emergency care if your symptoms are severe or life threatening.

## 2020-06-29 NOTE — ED Notes (Addendum)
ED Provider at bedside. 

## 2020-06-29 NOTE — Progress Notes (Signed)
Virtual Visit via Video Note  I connected with Antonio Watts  on 06/29/20 at 12:40 PM EDT by a video enabled telemedicine application and verified that I am speaking with the correct person using two identifiers.  Location patient: home, [place] Location provider:work or home office Persons participating in the virtual visit: patient, provider, patient's wife  I discussed the limitations of evaluation and management by telemedicine and the availability of in person appointments. The patient expressed understanding and agreed to proceed.   HPI:  Acute telemedicine visit for sinus issues and cough: -Onset: bad allergies at baseline during the spring but a lot worse the last 3 days -Symptoms include: green mucus, cough, coughing up thick mucus, sinus discomfort -reports PCP usually gives him azithromycin for this -Denies: fevers, body aches, NVD, CP, SOB, inability to eat/drink/get out of bed, known sick contacts -Has tried: claritin, but not taking on a regular basis -Pertinent past medical history:allergies, HTN -Pertinent medication allergies: doxycycline -COVID-19 vaccine status: none, no flu shot  ROS: See pertinent positives and negatives per HPI.  Past Medical History:  Diagnosis Date  . Hypertension   . Kidney calculi   . Kidney stones   . Low testosterone   . OSA (obstructive sleep apnea)     No past surgical history on file.   Current Outpatient Medications:  .  azithromycin (ZITHROMAX) 250 MG tablet, 2 tabs day 1, then one tab daily, Disp: 6 tablet, Rfl: 0 .  benzonatate (TESSALON PERLES) 100 MG capsule, Take 1 capsule (100 mg total) by mouth 3 (three) times daily as needed., Disp: 20 capsule, Rfl: 0 .  albuterol (VENTOLIN HFA) 108 (90 Base) MCG/ACT inhaler, Inhale 2 puffs into the lungs every 6 (six) hours as needed for wheezing or shortness of breath., Disp: 18 g, Rfl: 0 .  b complex vitamins tablet, Take 1 tablet by mouth daily., Disp: , Rfl:  .  cyclobenzaprine (FLEXERIL) 10  MG tablet, Take 1 tablet (10 mg total) by mouth 3 (three) times daily as needed for muscle spasms., Disp: 30 tablet, Rfl: 0 .  gabapentin (NEURONTIN) 300 MG capsule, TAKE 1 CAPSULE(300 MG) BY MOUTH THREE TIMES DAILY, Disp: 90 capsule, Rfl: 3 .  lisinopril (ZESTRIL) 10 MG tablet, TAKE 1 TABLET(10 MG) BY MOUTH DAILY, Disp: 90 tablet, Rfl: 2 .  MELATONIN PO, Take by mouth., Disp: , Rfl:  .  meloxicam (MOBIC) 15 MG tablet, TAKE 1 TABLET(15 MG) BY MOUTH DAILY, Disp: 30 tablet, Rfl: 3 .  Multiple Vitamin (MULTIVITAMIN WITH MINERALS) TABS tablet, Take 1 tablet by mouth daily., Disp: , Rfl:  .  Multiple Vitamins-Minerals (EMERGEN-C IMMUNE PLUS) PACK, Take 1 tablet by mouth 2 (two) times daily., Disp: 10 each, Rfl: 0 .  NON FORMULARY, CPAP at bedtime, Disp: , Rfl:  .  ondansetron (ZOFRAN ODT) 4 MG disintegrating tablet, Allow 1-2 tablets to dissolve in your mouth every 8 hours as needed for nausea/vomiting, Disp: 30 tablet, Rfl: 0 .  predniSONE (DELTASONE) 10 MG tablet, Take 1 tablet (10 mg total) by mouth daily with breakfast. 6/5/4/3/2/1 taper, Disp: 21 tablet, Rfl: 0  EXAM:  VITALS per patient if applicable:  GENERAL: alert, oriented, appears well and in no acute distress  HEENT: atraumatic, conjunttiva clear, no obvious abnormalities on inspection of external nose and ears  NECK: normal movements of the head and neck  LUNGS: on inspection no signs of respiratory distress, breathing rate appears normal, no obvious gross SOB, gasping or wheezing  CV: no obvious cyanosis  MS:  moves all visible extremities without noticeable abnormality  PSYCH/NEURO: pleasant and cooperative, no obvious depression or anxiety, speech and thought processing grossly intact  ASSESSMENT AND PLAN:  Discussed the following assessment and plan:  Nasal sinus congestion  Facial discomfort  Cough  -we discussed possible serious and likely etiologies, options for evaluation and workup, limitations of telemedicine  visit vs in person visit, treatment, treatment risks and precautions. Pt prefers to treat via telemedicine empirically rather than in person at this moment. Query VRI, allergic related symptoms, developing sinusitis versus other.  He feels strongly that an antibiotic, Z-Pak, will help him with this.  Discussed risk, benefits and indications for antibiotics.  Advised nasal saline, allergy regimen, Tessalon for cough and delayed antibiotics.  Sent azithromycin, in case worsening or not improving over the next several days with other treatments.  Also advised Covid testing.  He agrees to do home Covid testing.  Discussed potential complications, precautions, and advised if he was interested in treatment to schedule follow-up video visit with PCP or [place] if Covid test is positive. Work/School slipped offered: provided in patient instructions   Scheduled follow up with PCP offered: Agrees to follow-up if needed. Advised to seek prompt in person care if worsening, new symptoms arise, or if is not improving with treatment. Discussed options for inperson care if PCP office not available. Did let this patient know that I only do telemedicine on Tuesdays and Thursdays for [facility]. Advised to schedule follow up visit with PCP or [facility] if any further questions or concerns to avoid delays in care.   I discussed the assessment and treatment plan with the patient. The patient was provided an opportunity to ask questions and all were answered. The patient agreed with the plan and demonstrated an understanding of the instructions.     Terressa Koyanagi, DO

## 2020-07-11 DIAGNOSIS — G4733 Obstructive sleep apnea (adult) (pediatric): Secondary | ICD-10-CM | POA: Diagnosis not present

## 2020-07-21 ENCOUNTER — Other Ambulatory Visit: Payer: Self-pay | Admitting: Family Medicine

## 2020-08-10 DIAGNOSIS — G4733 Obstructive sleep apnea (adult) (pediatric): Secondary | ICD-10-CM | POA: Diagnosis not present

## 2020-09-04 ENCOUNTER — Other Ambulatory Visit: Payer: Self-pay | Admitting: Family Medicine

## 2020-09-10 DIAGNOSIS — G4733 Obstructive sleep apnea (adult) (pediatric): Secondary | ICD-10-CM | POA: Diagnosis not present

## 2020-09-22 ENCOUNTER — Other Ambulatory Visit: Payer: Self-pay | Admitting: Podiatry

## 2020-09-28 ENCOUNTER — Other Ambulatory Visit: Payer: Self-pay

## 2020-10-02 ENCOUNTER — Ambulatory Visit: Payer: BC Managed Care – PPO | Admitting: Family Medicine

## 2020-10-03 DIAGNOSIS — G4733 Obstructive sleep apnea (adult) (pediatric): Secondary | ICD-10-CM | POA: Diagnosis not present

## 2020-10-10 DIAGNOSIS — G4733 Obstructive sleep apnea (adult) (pediatric): Secondary | ICD-10-CM | POA: Diagnosis not present

## 2020-11-21 ENCOUNTER — Other Ambulatory Visit: Payer: Self-pay | Admitting: Gastroenterology

## 2020-11-21 ENCOUNTER — Other Ambulatory Visit: Payer: Self-pay

## 2020-11-21 ENCOUNTER — Ambulatory Visit (AMBULATORY_SURGERY_CENTER): Payer: BC Managed Care – PPO | Admitting: *Deleted

## 2020-11-21 VITALS — Ht 69.0 in | Wt 330.0 lb

## 2020-11-21 DIAGNOSIS — Z1211 Encounter for screening for malignant neoplasm of colon: Secondary | ICD-10-CM

## 2020-11-21 MED ORDER — PEG-KCL-NACL-NASULF-NA ASC-C 100 G PO SOLR: 1.0000 | Freq: Once | ORAL | 0 refills | Status: AC

## 2020-11-21 NOTE — Progress Notes (Signed)
No egg or soy allergy known to patient  No issues with past sedation with any surgeries or procedures Patient denies ever being told they had issues or difficulty with intubation  No FH of Malignant Hyperthermia No diet pills per patient No home 02 use per patient  No blood thinners per patient  Pt denies issues with constipation  No A fib or A flutter  EMMI video to pt or via MyChart  COVID 19 guidelines implemented in PV today with Pt and RN   Pt is not vaccinated  for [employer] given to pt in PV today , Code to Pharmacy and  NO PA's for preps discussed with pt In PV today  Discussed with pt there will be an out-of-pocket cost for prep and that varies from $0 to 70 +  dollars   Due to the COVID-19 pandemic we are asking patients to follow certain guidelines.  Pt aware of COVID protocols and [facility] guidelines   PV was done with pt's wife today

## 2020-11-29 ENCOUNTER — Encounter: Payer: Self-pay | Admitting: Gastroenterology

## 2020-12-01 ENCOUNTER — Telehealth: Payer: Self-pay | Admitting: Gastroenterology

## 2020-12-01 DIAGNOSIS — Z1211 Encounter for screening for malignant neoplasm of colon: Secondary | ICD-10-CM

## 2020-12-01 MED ORDER — PEG-KCL-NACL-NASULF-NA ASC-C 100 G PO SOLR: 1.0000 | Freq: Once | ORAL | 0 refills | Status: AC

## 2020-12-01 NOTE — Telephone Encounter (Signed)
Movi prep Rx sent to [facility] per request.

## 2020-12-01 NOTE — Telephone Encounter (Signed)
Pt's wife called stating that [facility] gave her generic for Moviprep. She did not realize until she got home that it was a gallon of prep so she was given PEG 3350. She needs new instructions for that prep. Instructions can be sent via Mychart.

## 2020-12-01 NOTE — Telephone Encounter (Signed)
Sent new prep instructions for Gol[facility] to pt's Mychart.

## 2020-12-01 NOTE — Telephone Encounter (Signed)
Patient notified the rx was sent to [facility].

## 2020-12-01 NOTE — Telephone Encounter (Signed)
Pt's wife Lawson Fiscal called to inform that pt's pharmacy does not have Moviprep in stock. She is requesting to try sending it to [facility] on [address] In [place]. Maybe they have it.

## 2020-12-05 ENCOUNTER — Ambulatory Visit (AMBULATORY_SURGERY_CENTER): Payer: BC Managed Care – PPO | Admitting: Gastroenterology

## 2020-12-05 ENCOUNTER — Other Ambulatory Visit: Payer: Self-pay

## 2020-12-05 ENCOUNTER — Encounter: Payer: Self-pay | Admitting: Gastroenterology

## 2020-12-05 VITALS — BP 151/98 | HR 62 | Temp 98.1°F | Resp 16 | Ht 69.0 in | Wt 320.0 lb

## 2020-12-05 DIAGNOSIS — Z1211 Encounter for screening for malignant neoplasm of colon: Secondary | ICD-10-CM

## 2020-12-05 HISTORY — PX: COLONOSCOPY WITH PROPOFOL: SHX5780

## 2020-12-05 MED ORDER — SODIUM CHLORIDE 0.9 % IV SOLN: 500.0000 mL | Freq: Once | INTRAVENOUS | Status: DC

## 2020-12-05 NOTE — Patient Instructions (Signed)
YOU HAD AN ENDOSCOPIC PROCEDURE TODAY AT THE [facility]:   Refer to the procedure report that was given to you for any specific questions about what was found during the examination.  If the procedure report does not answer your questions, please call your gastroenterologist to clarify.  If you requested that your care partner not be given the details of your procedure findings, then the procedure report has been included in a sealed envelope for you to review at your convenience later.  YOU SHOULD EXPECT: Some feelings of bloating in the abdomen. Passage of more gas than usual.  Walking can help get rid of the air that was put into your GI tract during the procedure and reduce the bloating. If you had a lower endoscopy (such as a colonoscopy or flexible sigmoidoscopy) you may notice spotting of blood in your stool or on the toilet paper. If you underwent a bowel prep for your procedure, you may not have a normal bowel movement for a few days.  Please Note:  You might notice some irritation and congestion in your nose or some drainage.  This is from the oxygen used during your procedure.  There is no need for concern and it should clear up in a day or so.  SYMPTOMS TO REPORT IMMEDIATELY:   Following lower endoscopy (colonoscopy or flexible sigmoidoscopy):  Excessive amounts of blood in the stool  Significant tenderness or worsening of abdominal pains  Swelling of the abdomen that is new, acute  Fever of 100F or higher  For urgent or emergent issues, a gastroenterologist can be reached at any hour by calling [phone]. Do not use MyChart messaging for urgent concerns.    DIET:  We do recommend a small meal at first, but then you may proceed to your regular diet.  Drink plenty of fluids but you should avoid alcoholic beverages for 24 hours.  ACTIVITY:  You should plan to take it easy for the rest of today and you should NOT DRIVE or use heavy machinery until tomorrow (because  of the sedation medicines used during the test).    FOLLOW UP: Our staff will call the number listed on your records 48-72 hours following your procedure to check on you and address any questions or concerns that you may have regarding the information given to you following your procedure. If we do not reach you, we will leave a message.  We will attempt to reach you two times.  During this call, we will ask if you have developed any symptoms of COVID 19. If you develop any symptoms (ie: fever, flu-like symptoms, shortness of breath, cough etc.) before then, please call [phone].  If you test positive for Covid 19 in the 2 weeks post procedure, please call and report this information to us.    If any biopsies were taken you will be contacted by phone or by letter within the next 1-3 weeks.  Please call us at [phone] if you have not heard about the biopsies in 3 weeks.    SIGNATURES/CONFIDENTIALITY: You and/or your care partner have signed paperwork which will be entered into your electronic medical record.  These signatures attest to the fact that that the information above on your After Visit Summary has been reviewed and is understood.  Full responsibility of the confidentiality of this discharge information lies with you and/or your care-partner. 

## 2020-12-05 NOTE — Progress Notes (Signed)
History & Physical  Primary Care Physician:  Kristian Covey, MD Primary Gastroenterologist: Judie Petit. Russella Dar, MD  CHIEF COMPLAINT:  CRC screening   HPI: Antonio Watts is a 60 y.o. male who presents for a screening colonoscopy.  He has no active gastrointestinal complaints.  This is his second colonoscopy.   Past Medical History:  Diagnosis Date   Allergy    seasonal   COVID-19 virus infection    03-2020, 10-2020   Heartburn    OCC- no meds   Hypertension    Kidney calculi    Kidney stones    Low testosterone    OSA (obstructive sleep apnea)    Sleep apnea    wears cpap   Syncope 06/29/2020   due to dehydration    Past Surgical History:  Procedure Laterality Date   COLONOSCOPY  09/18/2009   COLONOSCOPY WITH PROPOFOL  12/05/2020   Claudette Head    Prior to Admission medications   Medication Sig Start Date End Date Taking? Authorizing Provider  b complex vitamins tablet Take 1 tablet by mouth daily.   Yes [provider]  diphenhydrAMINE HCl (ALLERGY MED PO) Take by mouth.   Yes [provider]  gabapentin (NEURONTIN) 300 MG capsule TAKE 1 CAPSULE(300 MG) BY MOUTH THREE TIMES DAILY 09/04/20  Yes Burchette, Elberta Fortis, MD  lisinopril (ZESTRIL) 10 MG tablet TAKE 1 TABLET(10 MG) BY MOUTH DAILY 04/03/20  Yes Burchette, Elberta Fortis, MD  MELATONIN PO Take by mouth.   Yes [provider]  Multiple Vitamin (MULTIVITAMIN WITH MINERALS) TABS tablet Take 1 tablet by mouth daily.   Yes [provider]  Multiple Vitamins-Minerals (EMERGEN-C IMMUNE PLUS) PACK Take 1 tablet by mouth 2 (two) times daily. 04/14/20  Yes Tysinger, Kermit Balo, PA-C  NON FORMULARY CPAP at bedtime   Yes [provider]  meloxicam (MOBIC) 15 MG tablet TAKE 1 TABLET(15 MG) BY MOUTH DAILY 09/22/20   Hyatt, Max T, DPM    Current Outpatient Medications  Medication Sig Dispense Refill   b complex vitamins tablet Take 1 tablet by mouth daily.     diphenhydrAMINE HCl (ALLERGY MED PO)  Take by mouth.     gabapentin (NEURONTIN) 300 MG capsule TAKE 1 CAPSULE(300 MG) BY MOUTH THREE TIMES DAILY 90 capsule 3   lisinopril (ZESTRIL) 10 MG tablet TAKE 1 TABLET(10 MG) BY MOUTH DAILY 90 tablet 2   MELATONIN PO Take by mouth.     Multiple Vitamin (MULTIVITAMIN WITH MINERALS) TABS tablet Take 1 tablet by mouth daily.     Multiple Vitamins-Minerals (EMERGEN-C IMMUNE PLUS) PACK Take 1 tablet by mouth 2 (two) times daily. 10 each 0   NON FORMULARY CPAP at bedtime     meloxicam (MOBIC) 15 MG tablet TAKE 1 TABLET(15 MG) BY MOUTH DAILY 30 tablet 3   Current Facility-Administered Medications  Medication Dose Route Frequency Provider Last Rate Last Admin   0.9 %  sodium chloride infusion  500 mL Intravenous Once Meryl Dare, MD        Allergies as of 12/05/2020 - Review Complete 12/05/2020  Allergen Reaction Noted   Doxycycline hyclate  11/14/2009    Family History  Problem Relation Age of Onset   Pulmonary fibrosis Mother    Cancer Other        fhx   Heart disease Other        fhx   Heart failure Other    Colon cancer Neg Hx    Colon polyps Neg  Hx    Esophageal cancer Neg Hx    Stomach cancer Neg Hx    Rectal cancer Neg Hx     Social History   Socioeconomic History   Marital status: Married    Spouse name: Not on file   Number of children: Not on file   Years of education: Not on file   Highest education level: Not on file  Occupational History   Not on file  Tobacco Use   Smoking status: Former    Packs/day: 1.00    Years: 35.00    Pack years: 35.00    Types: Cigarettes    Quit date: 05/26/2011    Years since quitting: 9.5   Smokeless tobacco: Never  Vaping Use   Vaping Use: Never used  Substance and Sexual Activity   Alcohol use: No   Drug use: No   Sexual activity: Yes  Other Topics Concern   Not on file  Social History Narrative   Not on file   Social Determinants of Health   Financial Resource Strain: Not on file  Food Insecurity: Not on file   Transportation Needs: Not on file  Physical Activity: Not on file  Stress: Not on file  Social Connections: Not on file  Intimate Partner Violence: Not on file    Review of Systems:  All systems reviewed an negative except where noted in HPI.  Gen: Denies any fever, chills, sweats, anorexia, fatigue, weakness, malaise, weight loss, and sleep disorder CV: Denies chest pain, angina, palpitations, syncope, orthopnea, PND, peripheral edema, and claudication. Resp: Denies dyspnea at rest, dyspnea with exercise, cough, sputum, wheezing, coughing up blood, and pleurisy. GI: Denies vomiting blood, jaundice, and fecal incontinence.   Denies dysphagia or odynophagia. GU : Denies urinary burning, blood in urine, urinary frequency, urinary hesitancy, nocturnal urination, and urinary incontinence. MS: Denies joint pain, limitation of movement, and swelling, stiffness, low back pain, extremity pain. Denies muscle weakness, cramps, atrophy.  Derm: Denies rash, itching, dry skin, hives, moles, warts, or unhealing ulcers.  Psych: Denies depression, anxiety, memory loss, suicidal ideation, hallucinations, paranoia, and confusion. Heme: Denies bruising, bleeding, and enlarged lymph nodes. Neuro:  Denies any headaches, dizziness, paresthesias. Endo:  Denies any problems with DM, thyroid, adrenal function.   Physical Exam: General:  Alert, well-developed, in NAD Head:  Normocephalic and atraumatic. Eyes:  Sclera clear, no icterus.   Conjunctiva pink. Ears:  Normal auditory acuity. Mouth:  No deformity or lesions.  Neck:  Supple; no masses . Lungs:  Clear throughout to auscultation.   No wheezes, crackles, or rhonchi. No acute distress. Heart:  Regular rate and rhythm; no murmurs. Abdomen:  Soft, nondistended, nontender. No masses, hepatomegaly. No obvious masses.  Normal bowel .    Rectal:  Deferred   Msk:  Symmetrical without gross deformities.. Pulses:  Normal pulses noted. Extremities:  Without  edema. Neurologic:  Alert and  oriented x4;  grossly normal neurologically. Skin:  Intact without significant lesions or rashes. Cervical Nodes:  No significant cervical adenopathy. Psych:  Alert and cooperative. Normal mood and affect.   Impression / Plan:   CRC screening, average risk for colonoscopy.   This patient is appropriate for endoscopic procedures in the ambulatory setting.    Venita Lick. Russella Dar  12/05/2020, 9:28 AM

## 2020-12-05 NOTE — Progress Notes (Signed)
Pt's states no medical or surgical changes since previsit or office visit.  ° °Vitals CW °

## 2020-12-05 NOTE — Progress Notes (Signed)
Report to PACU, RN, vss, BBS= Clear.  

## 2020-12-05 NOTE — Op Note (Signed)
[facility] Patient Name: Antonio Watts Procedure Date: 12/05/2020 9:15 AM MRN: [id number] Endoscopist: Meryl Dare , MD Age: 60 Referring MD:  Date of Birth: 1960-10-26 Gender: Male Account #: [id number] Procedure:                Colonoscopy Indications:              Screening for colorectal malignant neoplasm Medicines:                Monitored Anesthesia Care Procedure:                Pre-Anesthesia Assessment:                           - Prior to the procedure, a History and Physical                            was performed, and patient medications and                            allergies were reviewed. The patient's tolerance of                            previous anesthesia was also reviewed. The risks                            and benefits of the procedure and the sedation                            options and risks were discussed with the patient.                            All questions were answered, and informed consent                            was obtained. Prior Anticoagulants: The patient has                            taken no previous anticoagulant or antiplatelet                            agents. ASA Grade Assessment: III - A patient with                            severe systemic disease. After reviewing the risks                            and benefits, the patient was deemed in                            satisfactory condition to undergo the procedure.                           After obtaining informed consent, the colonoscope  was passed under direct vision. Throughout the                            procedure, the patient's blood pressure, pulse, and                            oxygen saturations were monitored continuously. The                            CF [id number] #[id number] was introduced through the anus                            and advanced to the the cecum, identified by                            appendiceal  orifice and ileocecal valve. The                            ileocecal valve, appendiceal orifice, and rectum                            were photographed. The quality of the bowel                            preparation was adequate after extensive lavage,                            suction. The colonoscopy was performed without                            difficulty. The patient tolerated the procedure                            well. Scope In: 9:30:58 AM Scope Out: 9:47:29 AM Scope Withdrawal Time: 0 hours 14 minutes 17 seconds  Total Procedure Duration: 0 hours 16 minutes 31 seconds  Findings:                 The perianal and digital rectal examinations were                            normal.                           A few small-mouthed diverticula were found in the                            left colon. There was no evidence of diverticular                            bleeding.                           Internal hemorrhoids were found during  retroflexion. The hemorrhoids were small and Grade                            I (internal hemorrhoids that do not prolapse).                           The exam was otherwise without abnormality on                            direct and retroflexion views. Complications:            No immediate complications. Estimated blood loss:                            None. Estimated Blood Loss:     Estimated blood loss: none. Impression:               - Mild diverticulosis in the left colon.                           - Internal hemorrhoids.                           - The examination was otherwise normal on direct                            and retroflexion views.                           - No specimens collected. Recommendation:           - Repeat colonoscopy in 10 years for screening                            purposes.                           - Patient has a contact number available for                             emergencies. The signs and symptoms of potential                            delayed complications were discussed with the                            patient. Return to normal activities tomorrow.                            Written discharge instructions were provided to the                            patient.                           - High fiber diet.                           -  Continue present medications. Meryl Dare, MD 12/05/2020 9:50:20 AM This report has been signed electronically.

## 2020-12-07 ENCOUNTER — Telehealth: Payer: Self-pay

## 2020-12-07 NOTE — Telephone Encounter (Signed)
  Follow up Call-  Call back number 12/05/2020  Post procedure Call Back phone  # [phone]- Lawson Fiscal - wife  Permission to leave phone message Yes  Some recent data might be hidden     Patient questions:  Do you have a fever, pain , or abdominal swelling? No. Pain Score  0 *  Have you tolerated food without any problems? Yes.    Have you been able to return to your normal activities? Yes.    Do you have any questions about your discharge instructions: Diet   No. Medications  No. Follow up visit  No.  Do you have questions or concerns about your Care? No.  Actions: * If pain score is 4 or above: No action needed, pain <4.  Have you developed a fever since your procedure? no  2.   Have you had an respiratory symptoms (SOB or cough) since your procedure? no  3.   Have you tested positive for COVID 19 since your procedure no  4.   Have you had any family members/close contacts diagnosed with the COVID 19 since your procedure?  no   If yes to any of these questions please route to Laverna Peace, RN and Karlton Lemon, RN

## 2021-01-11 ENCOUNTER — Other Ambulatory Visit: Payer: Self-pay | Admitting: Family Medicine

## 2021-01-19 DIAGNOSIS — G4733 Obstructive sleep apnea (adult) (pediatric): Secondary | ICD-10-CM | POA: Diagnosis not present

## 2021-02-10 ENCOUNTER — Other Ambulatory Visit: Payer: Self-pay | Admitting: Family Medicine

## 2021-02-10 ENCOUNTER — Other Ambulatory Visit: Payer: Self-pay | Admitting: Podiatry

## 2021-02-14 DIAGNOSIS — Z6841 Body Mass Index (BMI) 40.0 and over, adult: Secondary | ICD-10-CM | POA: Diagnosis not present

## 2021-02-14 DIAGNOSIS — I10 Essential (primary) hypertension: Secondary | ICD-10-CM | POA: Diagnosis not present

## 2021-02-19 DIAGNOSIS — G4733 Obstructive sleep apnea (adult) (pediatric): Secondary | ICD-10-CM | POA: Diagnosis not present

## 2021-03-12 ENCOUNTER — Other Ambulatory Visit: Payer: Self-pay | Admitting: Family Medicine

## 2021-03-21 DIAGNOSIS — G4733 Obstructive sleep apnea (adult) (pediatric): Secondary | ICD-10-CM | POA: Diagnosis not present

## 2021-05-11 ENCOUNTER — Other Ambulatory Visit: Payer: Self-pay | Admitting: Podiatry

## 2021-06-19 DIAGNOSIS — J321 Chronic frontal sinusitis: Secondary | ICD-10-CM | POA: Diagnosis not present

## 2021-06-28 DIAGNOSIS — M25562 Pain in left knee: Secondary | ICD-10-CM | POA: Diagnosis not present

## 2021-07-10 ENCOUNTER — Other Ambulatory Visit: Payer: Self-pay | Admitting: Family Medicine

## 2021-07-10 DIAGNOSIS — G4733 Obstructive sleep apnea (adult) (pediatric): Secondary | ICD-10-CM | POA: Diagnosis not present

## 2021-07-27 DIAGNOSIS — M25562 Pain in left knee: Secondary | ICD-10-CM | POA: Diagnosis not present

## 2021-08-09 DIAGNOSIS — G4733 Obstructive sleep apnea (adult) (pediatric): Secondary | ICD-10-CM | POA: Diagnosis not present

## 2021-09-09 DIAGNOSIS — G4733 Obstructive sleep apnea (adult) (pediatric): Secondary | ICD-10-CM | POA: Diagnosis not present

## 2021-10-08 ENCOUNTER — Other Ambulatory Visit: Payer: Self-pay | Admitting: Family Medicine

## 2021-11-23 DIAGNOSIS — G4733 Obstructive sleep apnea (adult) (pediatric): Secondary | ICD-10-CM | POA: Diagnosis not present

## 2021-12-23 DIAGNOSIS — G4733 Obstructive sleep apnea (adult) (pediatric): Secondary | ICD-10-CM | POA: Diagnosis not present

## 2022-01-23 DIAGNOSIS — G4733 Obstructive sleep apnea (adult) (pediatric): Secondary | ICD-10-CM | POA: Diagnosis not present

## 2022-03-06 DIAGNOSIS — G4733 Obstructive sleep apnea (adult) (pediatric): Secondary | ICD-10-CM | POA: Diagnosis not present

## 2022-04-05 DIAGNOSIS — Z125 Encounter for screening for malignant neoplasm of prostate: Secondary | ICD-10-CM | POA: Diagnosis not present

## 2022-04-05 DIAGNOSIS — Z0000 Encounter for general adult medical examination without abnormal findings: Secondary | ICD-10-CM | POA: Diagnosis not present

## 2022-04-06 DIAGNOSIS — G4733 Obstructive sleep apnea (adult) (pediatric): Secondary | ICD-10-CM | POA: Diagnosis not present

## 2022-04-12 DIAGNOSIS — I10 Essential (primary) hypertension: Secondary | ICD-10-CM | POA: Diagnosis not present

## 2022-04-12 DIAGNOSIS — Z0000 Encounter for general adult medical examination without abnormal findings: Secondary | ICD-10-CM | POA: Diagnosis not present

## 2022-04-16 ENCOUNTER — Other Ambulatory Visit (HOSPITAL_COMMUNITY): Payer: Self-pay | Admitting: Registered Nurse

## 2022-04-29 ENCOUNTER — Ambulatory Visit (HOSPITAL_COMMUNITY)
Admission: RE | Admit: 2022-04-29 | Discharge: 2022-04-29 | Disposition: A | Discharge status: Home / Self Care | Payer: BC Managed Care – PPO | Source: Ambulatory Visit | Attending: Registered Nurse | Admitting: Registered Nurse

## 2022-05-07 DIAGNOSIS — G4733 Obstructive sleep apnea (adult) (pediatric): Secondary | ICD-10-CM | POA: Diagnosis not present

## 2022-05-10 DIAGNOSIS — G4733 Obstructive sleep apnea (adult) (pediatric): Secondary | ICD-10-CM | POA: Diagnosis not present

## 2022-05-17 DIAGNOSIS — I700 Atherosclerosis of aorta: Secondary | ICD-10-CM | POA: Diagnosis not present

## 2022-05-17 DIAGNOSIS — I10 Essential (primary) hypertension: Secondary | ICD-10-CM | POA: Diagnosis not present

## 2022-05-17 DIAGNOSIS — I2510 Atherosclerotic heart disease of native coronary artery without angina pectoris: Secondary | ICD-10-CM | POA: Diagnosis not present

## 2022-05-17 DIAGNOSIS — I7121 Aneurysm of the ascending aorta, without rupture: Secondary | ICD-10-CM | POA: Diagnosis not present

## 2022-05-24 ENCOUNTER — Ambulatory Visit: Payer: BC Managed Care – PPO | Admitting: Cardiovascular Disease

## 2022-05-27 ENCOUNTER — Other Ambulatory Visit (HOSPITAL_COMMUNITY): Payer: Self-pay | Admitting: Internal Medicine

## 2022-05-27 DIAGNOSIS — I7121 Aneurysm of the ascending aorta, without rupture: Secondary | ICD-10-CM

## 2022-06-14 ENCOUNTER — Ambulatory Visit (HOSPITAL_COMMUNITY)
Admission: RE | Admit: 2022-06-14 | Discharge: 2022-06-14 | Disposition: A | Discharge status: Home / Self Care | Payer: BC Managed Care – PPO | Source: Ambulatory Visit | Attending: Internal Medicine | Admitting: Internal Medicine

## 2022-06-14 DIAGNOSIS — I7121 Aneurysm of the ascending aorta, without rupture: Secondary | ICD-10-CM | POA: Insufficient documentation

## 2022-06-14 MED ORDER — IOHEXOL 350 MG/ML SOLN
75.0000 mL | Freq: Once | INTRAVENOUS | Status: AC | PRN
  Administered 2022-06-14: 75 mL via INTRAVENOUS

## 2022-06-17 DIAGNOSIS — I7121 Aneurysm of the ascending aorta, without rupture: Secondary | ICD-10-CM | POA: Diagnosis not present

## 2022-06-17 DIAGNOSIS — I10 Essential (primary) hypertension: Secondary | ICD-10-CM | POA: Diagnosis not present

## 2022-06-17 DIAGNOSIS — I700 Atherosclerosis of aorta: Secondary | ICD-10-CM | POA: Diagnosis not present

## 2022-06-17 DIAGNOSIS — F418 Other specified anxiety disorders: Secondary | ICD-10-CM | POA: Diagnosis not present

## 2022-06-20 NOTE — Progress Notes (Signed)
[address]       [place] [address]             [place] MI:[id number] 1960-06-18  History of Present Illness:  Antonio Watts is a 62 yo male with history of Nicotine abuse, HTN, and HLD.  He recently underwent a calcium CT scoring study which incidentally found the patient to have a 4.8 cm Ascending aortic aneurysm.  This was done over the patient's primary care physicians concern about the patient's blood pressure and elevated cholesterol.  With the incidental finding on his gated CT scan a CTA of the Chest with Aortic Imaging where he is noted to have an Aortic Aneurysm measuring 4.5 cm and 4.2 cm at the Sinus of Valsalva.  He has been referred for surgical evaluation.    He has never experienced chest pain or shortness of breath.  He quit smoking back in 2012 prior to that he smoked 2-3 ppd x 30 years.  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  The patient states his grandfather had several heart attacks, but no family history of coronary aneurysm to his knowledge.  He states he is trying to lose some weight.  Current Outpatient Medications on File Prior to Visit  Medication Sig Dispense Refill   amLODipine (NORVASC) 5 MG tablet Take 5 mg by mouth daily.     b complex vitamins tablet Take 1 tablet by mouth daily.     diphenhydrAMINE HCl (ALLERGY MED PO) Take by mouth.     escitalopram (LEXAPRO) 5 MG tablet Take 5 mg by mouth daily.     gabapentin (NEURONTIN) 300 MG capsule TAKE 1 CAPSULE(300 MG) BY MOUTH THREE TIMES DAILY 90 capsule 0   lisinopril (ZESTRIL) 10 MG tablet TAKE 1 TABLET(10 MG) BY MOUTH DAILY (Patient taking differently: Take 20 mg by mouth daily.) 90 tablet 2   MELATONIN PO Take by mouth.     meloxicam (MOBIC) 15 MG tablet TAKE 1 TABLET(15 MG) BY MOUTH DAILY 30 tablet 3   Multiple Vitamin (MULTIVITAMIN WITH MINERALS) TABS tablet Take 1 tablet by  mouth daily.     Multiple Vitamins-Minerals (EMERGEN-C IMMUNE PLUS) PACK Take 1 tablet by mouth 2 (two) times daily. 10 each 0   NON FORMULARY CPAP at bedtime     No current facility-administered medications on file prior to visit.     BP 118/72   Pulse 67   Resp 20   Ht 5\' 9"  (1.753 m)   Wt (!) 320 lb (145.2 kg)   SpO2 97% Comment: RA  BMI 47.26 kg/m   Physical Exam  Gen; NAD Heart RRR Lungs: CTA bilaterally Neck: no Carotid Bruit Ext: trace edema Neuro: grossly intact  CTA Results:  CLINICAL DATA:  62 year old male with a history of ascending aortic aneurysm   EXAM: CT ANGIOGRAPHY CHEST WITH CONTRAST   TECHNIQUE: Multidetector CT imaging of the chest was performed using the standard protocol during bolus administration of intravenous contrast. Multiplanar CT image reconstructions and MIPs were obtained to evaluate the vascular anatomy.   RADIATION DOSE REDUCTION: This exam was performed according to the departmental dose-optimization program which includes automated exposure control, adjustment of the mA and/or kV according to patient size and/or use of iterative reconstruction technique.   CONTRAST:  68mL OMNIPAQUE IOHEXOL 350 MG/ML SOLN   COMPARISON:  04/29/2022,   FINDINGS: Cardiovascular:   Heart:   Heart size enlarged. No pericardial fluid/thickening. Calcifications of the left anterior descending coronary artery.   Aorta:   No significant aortic valve calcifications.   Greatest estimated diameter of the aortic annulus 35 mm on the coronal reformatted images.   Estimated diameter of the sinuses of Valsalva in a on the coronal image, 4.2 cm.   Greatest estimated diameter of the sino-tubular junction, 35 mm on the coronal images.   Greatest estimated diameter of the ascending aorta on the axial images, 45 mm.   Mild atherosclerotic changes of the aorta. Branch vessels are patent with a 3 vessel arch. Cervical cerebral vessels patent at  the base of the neck.   No pedunculated plaque, ulcerated plaque, dissection, periaortic fluid. No wall thickening.   Pulmonary arteries:   Timing of the contrast bolus is not optimized for evaluation of pulmonary artery filling defects. Unremarkable size of the main pulmonary artery.   Mediastinum/Nodes: Small lymph nodes of the mediastinum, none of which are enlarged. Unremarkable appearance of the thoracic esophagus.   Unremarkable appearance of the thoracic inlet.   Lungs/Pleura: Central airways are clear. No pleural effusion. No confluent airspace disease.   No pneumothorax.   Upper Abdomen: No acute finding of the upper abdomen.   Musculoskeletal: No acute displaced fracture. Degenerative changes of the spine.   Review of the MIP images confirms the above findings.   IMPRESSION: No acute CT finding.   Greatest diameter of the ascending aorta estimated 4.5 cm on the current CT. Ascending thoracic aortic aneurysm. Recommend semi-annual imaging followup by CTA or MRA and referral to cardiothoracic surgery if not already obtained. This recommendation follows 2010 ACCF/AHA/AATS/ACR/ASA/SCA/SCAI/SIR/STS/SVM Guidelines for the Diagnosis and Management of Patients With Thoracic Aortic Disease. Circulation. 2010; 121ML:[id number]. Aortic aneurysm NOS (ICD10-I71.9)   The estimated diameter of the sinuses of Valsalva is 4.2 cm on the coronal images, meeting the threshold for sinus of Valsalva aneurysm. Referral for cardiothoracic surgery evaluation may be considered, particularly if there is known aortic valve dysfunction.   Mild aortic atherosclerosis and coronary artery disease. Aortic Atherosclerosis (ICD10-I70.0).   Signed,   Dulcy Fanny. Nadene Rubins, RPVI   [facility]   [facility]     Electronically Signed   By: Corrie Mckusick D.O.   On: 06/14/2022 11:20    A/P:  Ascending Aortic Aneurysm- measuring 4.2  cm at Sinus of Valsalva and 4.5 cm at Ascending portion- he will require semi-annual surveillance with repeat CTA in 6 months time HTN- well controlled Nicotine Abuse- patient quit in 2012, previous 2-3 ppd smoker HLD 5. Morbid Obesity   RTC in 6 months with repeat CTA chest.. there was no murmur present on exam, no known valvular dysfunction... If aneurysm enlarges may need to get Echocardiogram in future to assess for Aortic Valve Dysfunction  Risk Modification:  Statin:  No. Patient encouraged to speak with primary provide about initiation in setting of aortic aneurysm   Smoking cessation instruction/counseling given:  counseled patient on the dangers of tobacco use, advised patient to stop smoking, and reviewed strategies to maximize success  Patient was counseled on importance of Blood Pressure Control.  Despite Medical intervention if the patient notices persistently elevated blood pressure readings.  They are instructed to contact their Primary Care Physician  Please avoid use of Fluoroquinolones as this can potentially increase your risk of Aortic Rupture and/or Dissection  Patient educated on signs and symptoms  of Aortic Dissection, handout also provided in AVS  Antonio Mathieson, PA-C 06/24/22

## 2022-06-24 ENCOUNTER — Institutional Professional Consult (permissible substitution): Payer: BC Managed Care – PPO | Admitting: Physician Assistant

## 2022-06-24 VITALS — BP 118/72 | HR 67 | Resp 20 | Ht 69.0 in | Wt 320.0 lb

## 2022-06-24 DIAGNOSIS — I7121 Aneurysm of the ascending aorta, without rupture: Secondary | ICD-10-CM | POA: Diagnosis not present

## 2022-06-28 ENCOUNTER — Encounter (HOSPITAL_BASED_OUTPATIENT_CLINIC_OR_DEPARTMENT_OTHER): Payer: Self-pay | Admitting: Emergency Medicine

## 2022-06-28 ENCOUNTER — Emergency Department (HOSPITAL_BASED_OUTPATIENT_CLINIC_OR_DEPARTMENT_OTHER): Payer: BC Managed Care – PPO

## 2022-06-28 ENCOUNTER — Emergency Department (HOSPITAL_BASED_OUTPATIENT_CLINIC_OR_DEPARTMENT_OTHER)
Admission: EM | Admit: 2022-06-28 | Discharge: 2022-06-28 | Disposition: A | Discharge status: Home / Self Care | Payer: BC Managed Care – PPO | Attending: Emergency Medicine | Admitting: Emergency Medicine

## 2022-06-28 ENCOUNTER — Other Ambulatory Visit: Payer: Self-pay

## 2022-06-28 DIAGNOSIS — N23 Unspecified renal colic: Secondary | ICD-10-CM | POA: Diagnosis not present

## 2022-06-28 DIAGNOSIS — N132 Hydronephrosis with renal and ureteral calculous obstruction: Secondary | ICD-10-CM | POA: Diagnosis not present

## 2022-06-28 DIAGNOSIS — Z87891 Personal history of nicotine dependence: Secondary | ICD-10-CM | POA: Diagnosis not present

## 2022-06-28 DIAGNOSIS — R319 Hematuria, unspecified: Secondary | ICD-10-CM | POA: Diagnosis not present

## 2022-06-28 DIAGNOSIS — R310 Gross hematuria: Secondary | ICD-10-CM | POA: Diagnosis not present

## 2022-06-28 DIAGNOSIS — N1330 Unspecified hydronephrosis: Secondary | ICD-10-CM | POA: Diagnosis not present

## 2022-06-28 DIAGNOSIS — I700 Atherosclerosis of aorta: Secondary | ICD-10-CM | POA: Diagnosis not present

## 2022-06-28 LAB — URINALYSIS, ROUTINE W REFLEX MICROSCOPIC
Bilirubin Urine: NEGATIVE
Glucose, UA: NEGATIVE mg/dL
Hgb urine dipstick: NEGATIVE
Ketones, ur: NEGATIVE mg/dL
Leukocytes,Ua: NEGATIVE
Nitrite: NEGATIVE
Protein, ur: NEGATIVE mg/dL
Specific Gravity, Urine: >1.03 — ABNORMAL HIGH (ref 1.005–1.030)
pH: 6.5 (ref 5.0–8.0)

## 2022-06-28 LAB — CBC WITH DIFFERENTIAL/PLATELET
Abs Immature Granulocytes: 0.02 10*3/uL (ref 0.00–0.07)
Basophils Absolute: 0.1 10*3/uL (ref 0.0–0.1)
Basophils Relative: 1 %
Eosinophils Absolute: 0.5 10*3/uL (ref 0.0–0.5)
Eosinophils Relative: 6 %
HCT: 45.4 % (ref 39.0–52.0)
Hemoglobin: 14.5 g/dL (ref 13.0–17.0)
Immature Granulocytes: 0 %
Lymphocytes Relative: 29 %
Lymphs Abs: 2.5 10*3/uL (ref 0.7–4.0)
MCH: 24.1 pg — ABNORMAL LOW (ref 26.0–34.0)
MCHC: 31.9 g/dL (ref 30.0–36.0)
MCV: 75.5 fL — ABNORMAL LOW (ref 80.0–100.0)
Monocytes Absolute: 0.5 10*3/uL (ref 0.1–1.0)
Monocytes Relative: 6 %
Neutro Abs: 5.3 10*3/uL (ref 1.7–7.7)
Neutrophils Relative %: 58 %
Platelets: 229 10*3/uL (ref 150–400)
RBC: 6.01 MIL/uL — ABNORMAL HIGH (ref 4.22–5.81)
RDW: 15.9 % — ABNORMAL HIGH (ref 11.5–15.5)
WBC: 8.9 10*3/uL (ref 4.0–10.5)
nRBC: 0 % (ref 0.0–0.2)

## 2022-06-28 LAB — URINALYSIS, MICROSCOPIC (REFLEX): RBC / HPF: >50 RBC/hpf (ref 0–5)

## 2022-06-28 LAB — BASIC METABOLIC PANEL
Anion gap: 8 (ref 5–15)
BUN: 20 mg/dL (ref 8–23)
CO2: 25 mmol/L (ref 22–32)
Calcium: 10.2 mg/dL (ref 8.9–10.3)
Chloride: 106 mmol/L (ref 98–111)
Creatinine, Ser: 0.96 mg/dL (ref 0.61–1.24)
GFR, Estimated: >60 mL/min (ref >60)
Glucose, Bld: 126 mg/dL — ABNORMAL HIGH (ref 70–99)
Potassium: 4 mmol/L (ref 3.5–5.1)
Sodium: 139 mmol/L (ref 135–145)

## 2022-06-28 MED ORDER — SODIUM CHLORIDE 0.9 % IV SOLN
1.0000 g | Freq: Once | INTRAVENOUS | Status: AC
  Administered 2022-06-28: 1 g via INTRAVENOUS
  Filled 2022-06-28: qty 10

## 2022-06-28 MED ORDER — ONDANSETRON 4 MG PO TBDP
4.0000 mg | ORAL_TABLET | Freq: Once | ORAL | Status: AC
  Administered 2022-06-28: 4 mg via ORAL
  Filled 2022-06-28: qty 1

## 2022-06-28 MED ORDER — SULFAMETHOXAZOLE-TRIMETHOPRIM 800-160 MG PO TABS: 1.0000 | ORAL_TABLET | Freq: Two times a day (BID) | ORAL | 0 refills | Status: AC

## 2022-06-28 MED ORDER — FENTANYL CITRATE PF 50 MCG/ML IJ SOSY
50.0000 ug | PREFILLED_SYRINGE | Freq: Once | INTRAMUSCULAR | Status: AC
  Administered 2022-06-28: 50 ug via INTRAVENOUS
  Filled 2022-06-28: qty 1

## 2022-06-28 MED ORDER — SODIUM CHLORIDE 0.9 % IV SOLN
25.0000 mg | Freq: Four times a day (QID) | INTRAVENOUS | Status: DC | PRN
  Administered 2022-06-28: 25 mg via INTRAVENOUS
  Filled 2022-06-28: qty 1

## 2022-06-28 MED ORDER — KETOROLAC TROMETHAMINE 15 MG/ML IJ SOLN
15.0000 mg | Freq: Once | INTRAMUSCULAR | Status: AC
  Administered 2022-06-28: 15 mg via INTRAVENOUS
  Filled 2022-06-28: qty 1

## 2022-06-28 MED ORDER — HYDROCODONE-ACETAMINOPHEN 5-325 MG PO TABS: 1.0000 | ORAL_TABLET | Freq: Four times a day (QID) | ORAL | 0 refills | Status: DC | PRN

## 2022-06-28 MED ORDER — PROMETHAZINE HCL 25 MG/ML IJ SOLN
INTRAMUSCULAR | Status: AC
  Filled 2022-06-28: qty 1

## 2022-06-28 MED ORDER — IOHEXOL 300 MG/ML  SOLN
100.0000 mL | Freq: Once | INTRAMUSCULAR | Status: AC | PRN
  Administered 2022-06-28: 100 mL via INTRAVENOUS

## 2022-06-28 MED ORDER — ONDANSETRON 8 MG PO TBDP: 8.0000 mg | ORAL_TABLET | Freq: Three times a day (TID) | ORAL | 0 refills | Status: AC | PRN

## 2022-06-28 MED ORDER — IBUPROFEN 600 MG PO TABS: 600.0000 mg | ORAL_TABLET | Freq: Three times a day (TID) | ORAL | 0 refills | Status: DC | PRN

## 2022-06-28 NOTE — Discharge Instructions (Signed)
We saw you in the ER for the abdominal pain. °Our results indicate that you have a kidney stone. °We were able to get your pain is relative control, and we can safely send you home. ° °Take the meds prescribed. °Set up an appointment with the Urologist. °If the pain is unbearable, you start having fevers, chills, and are unable to keep any meds down - then return to the ER. °  °

## 2022-06-28 NOTE — ED Notes (Signed)
Pt discharged to home, NAD noted 

## 2022-06-28 NOTE — ED Provider Notes (Signed)
[place] EMERGENCY DEPARTMENT AT [facility] Provider Note   CSN: [id number] Arrival date & time: 06/28/22  1125     History {Add pertinent medical, surgical, social history, OB history to HPI:1} Chief Complaint  Patient presents with  . Hematuria    Antonio Watts is a 62 y.o. male.  HPI    62 year old male comes in with chief complaint of bloody urine.  Home Medications Prior to Admission medications   Medication Sig Start Date End Date Taking? Authorizing Provider  HYDROcodone-acetaminophen (NORCO/VICODIN) 5-325 MG tablet Take 1 tablet by mouth every 6 (six) hours as needed. 06/28/22  Yes Derwood Kaplan, MD  ibuprofen (ADVIL) 600 MG tablet Take 1 tablet (600 mg total) by mouth every 8 (eight) hours as needed. 06/28/22  Yes Marygrace Sandoval, MD  ondansetron (ZOFRAN-ODT) 8 MG disintegrating tablet Take 1 tablet (8 mg total) by mouth every 8 (eight) hours as needed for nausea. 06/28/22  Yes Derwood Kaplan, MD  sulfamethoxazole-trimethoprim (BACTRIM DS) 800-160 MG tablet Take 1 tablet by mouth 2 (two) times daily for 7 days. 06/28/22 07/05/22 Yes Stan Cantave, MD  amLODipine (NORVASC) 5 MG tablet Take 5 mg by mouth daily.    [provider]  b complex vitamins tablet Take 1 tablet by mouth daily.    [provider]  diphenhydrAMINE HCl (ALLERGY MED PO) Take by mouth.    [provider]  escitalopram (LEXAPRO) 5 MG tablet Take 5 mg by mouth daily.    [provider]  gabapentin (NEURONTIN) 300 MG capsule TAKE 1 CAPSULE(300 MG) BY MOUTH THREE TIMES DAILY 03/12/21   Burchette, Elberta Fortis, MD  lisinopril (ZESTRIL) 10 MG tablet TAKE 1 TABLET(10 MG) BY MOUTH DAILY Patient taking differently: Take 20 mg by mouth daily. 01/11/21   Burchette, Elberta Fortis, MD  MELATONIN PO Take by mouth.    [provider]  meloxicam (MOBIC) 15 MG tablet TAKE 1 TABLET(15 MG) BY MOUTH DAILY 05/11/21   Felecia Shelling, DPM  Multiple Vitamin (MULTIVITAMIN WITH  MINERALS) TABS tablet Take 1 tablet by mouth daily.    [provider]  Multiple Vitamins-Minerals (EMERGEN-C IMMUNE PLUS) PACK Take 1 tablet by mouth 2 (two) times daily. 04/14/20   Tysinger, Kermit Balo, PA-C  NON FORMULARY CPAP at bedtime    [provider]      Allergies    Doxycycline hyclate    Review of Systems   Review of Systems  Physical Exam Updated Vital Signs BP (!) 181/84   Pulse (!) 52   Temp 97.7 F (36.5 C) (Temporal)   Resp 16   SpO2 96%  Physical Exam  ED Results / Procedures / Treatments   Labs (all labs ordered are listed, but only abnormal results are displayed) Labs Reviewed  URINALYSIS, ROUTINE W REFLEX MICROSCOPIC - Abnormal; Notable for the following components:      Result Value   Color, Urine BROWN (*)    APPearance TURBID (*)    Specific Gravity, Urine >1.030 (*)    All other components within normal limits  BASIC METABOLIC PANEL - Abnormal; Notable for the following components:   Glucose, Bld 126 (*)    All other components within normal limits  CBC WITH DIFFERENTIAL/PLATELET - Abnormal; Notable for the following components:   RBC 6.01 (*)    MCV 75.5 (*)    MCH 24.1 (*)    RDW 15.9 (*)    All other components within normal limits  URINALYSIS, MICROSCOPIC (REFLEX) - Abnormal;  Notable for the following components:   Bacteria, UA MANY (*)    All other components within normal limits    EKG None  Radiology CT ABDOMEN PELVIS W WO CONTRAST  Result Date: 06/28/2022 CLINICAL DATA:  Gross painless hematuria EXAM: CT ABDOMEN AND PELVIS WITHOUT AND WITH CONTRAST TECHNIQUE: Pre contrast images were obtained. Multidetector CT imaging of the abdomen and pelvis was performed following the standard protocol before and following the bolus administration of intravenous contrast. RADIATION DOSE REDUCTION: This exam was performed according to the departmental dose-optimization program which includes automated exposure control, adjustment of  the mA and/or kV according to patient size and/or use of iterative reconstruction technique. CONTRAST:  100mL OMNIPAQUE IOHEXOL 300 MG/ML  SOLN COMPARISON:  07/29/2019 FINDINGS: Lower chest: No pleural or pericardial effusion. Hepatobiliary: No focal liver abnormality is seen. No gallstones, gallbladder wall thickening, or biliary dilatation. Pancreas: Unremarkable. No pancreatic ductal dilatation or surrounding inflammatory changes. Spleen: Normal in size without focal abnormality. Adrenals/Urinary Tract: No adrenal mass. Bilateral urolithiasis, largest on the right 4 mm in the upper pole collecting system. 9 mm linear calculus/calculi at the left UPJ, new since previous, with mild hydronephrosis. Urinary bladder is nondistended. Stomach/Bowel: Stomach is partially distended by ingested material, unremarkable. Small bowel decompressed. Normal appendix. Colon is partially distended by gas and fecal material, without acute finding. Vascular/Lymphatic: Scattered aortoiliac calcified atheromatous plaque without aneurysm or evident stenosis. Portal vein patent. No abdominal or pelvic adenopathy. Reproductive: Prostate is unremarkable. Other: No ascites.  No free air. Musculoskeletal: Paraumbilical hernia containing only mesenteric fat. Regional bones unremarkable. IMPRESSION: 1. 9 mm left UPJ calculus with mild hydronephrosis. 2. Bilateral nephrolithiasis. 3.  Aortic Atherosclerosis (ICD10-I70.0). Electronically Signed   By: Corlis Leak  Hassell M.D.   On: 06/28/2022 13:51    Procedures Procedures  {Document cardiac monitor, telemetry assessment procedure when appropriate:1}  Medications Ordered in ED Medications  promethazine (PHENERGAN) 25 mg in sodium chloride 0.9 % 50 mL IVPB (0 mg Intravenous Stopped 06/28/22 1519)  iohexol (OMNIPAQUE) 300 MG/ML solution 100 mL (100 mLs Intravenous Contrast Given 06/28/22 1305)  ondansetron (ZOFRAN-ODT) disintegrating tablet 4 mg (4 mg Oral Given 06/28/22 1404)  fentaNYL (SUBLIMAZE)  injection 50 mcg (50 mcg Intravenous Given 06/28/22 1452)  ketorolac (TORADOL) 15 MG/ML injection 15 mg (15 mg Intravenous Given 06/28/22 1452)  promethazine (PHENERGAN) 25 MG/ML injection (  Given 06/28/22 1458)    ED Course/ Medical Decision Making/ A&P Clinical Course as of 06/28/22 1558  Fri Jun 28, 2022  1557 CT ABDOMEN PELVIS W WO CONTRAST I independently interpreted CT scan of the abdomen and pelvis.  Patient has evidence of left-sided hydronephrosis.  There is a 9 mm UPJ stone.  Case discussed with urology.  Dr. Pete GlatterStoneking recommends IV ceftriaxone in the ER, Bactrim at the time of discharge with follow-up with urology.  Return precautions and the recommendations discussed with the patient and family. [AN]    Clinical Course User Index [AN] Derwood KaplanNanavati, Elanora Quin, MD   {   Click here for ABCD2, HEART and other calculatorsREFRESH Note before signing :1}                          Medical Decision Making Amount and/or Complexity of Data Reviewed Labs: ordered. Radiology: ordered.  Risk Prescription drug management.   ***  {Document critical care time when appropriate:1} {Document review of labs and clinical decision tools ie heart score, Chads2Vasc2 etc:1}  {Document your independent review of radiology images,  and any outside records:1} {Document your discussion with family members, caretakers, and with consultants:1} {Document social determinants of health affecting pt's care:1} {Document your decision making why or why not admission, treatments were needed:1} Final Clinical Impression(s) / ED Diagnoses Final diagnoses:  Ureteral colic  Gross hematuria    Rx / DC Orders ED Discharge Orders          Ordered    sulfamethoxazole-trimethoprim (BACTRIM DS) 800-160 MG tablet  2 times daily        06/28/22 1556    HYDROcodone-acetaminophen (NORCO/VICODIN) 5-325 MG tablet  Every 6 hours PRN        06/28/22 1556    ondansetron (ZOFRAN-ODT) 8 MG disintegrating tablet  Every 8 hours PRN         06/28/22 1556    ibuprofen (ADVIL) 600 MG tablet  Every 8 hours PRN        06/28/22 1556

## 2022-06-28 NOTE — ED Notes (Signed)
Pt expressed increase in nausea, "beginning of some pain too." EDP notified of same & 4mg  zofran given

## 2022-06-28 NOTE — ED Triage Notes (Signed)
Pt arrives to ED with c/o hematuria that started this morning. He denies pain.

## 2022-06-30 LAB — URINE CULTURE
Culture: NO GROWTH
Special Requests: NORMAL

## 2022-07-01 ENCOUNTER — Other Ambulatory Visit: Payer: Self-pay | Admitting: Urology

## 2022-07-01 DIAGNOSIS — I2510 Atherosclerotic heart disease of native coronary artery without angina pectoris: Secondary | ICD-10-CM | POA: Diagnosis not present

## 2022-07-01 DIAGNOSIS — N1330 Unspecified hydronephrosis: Secondary | ICD-10-CM | POA: Diagnosis not present

## 2022-07-01 DIAGNOSIS — I7121 Aneurysm of the ascending aorta, without rupture: Secondary | ICD-10-CM | POA: Diagnosis not present

## 2022-07-01 DIAGNOSIS — Z09 Encounter for follow-up examination after completed treatment for conditions other than malignant neoplasm: Secondary | ICD-10-CM | POA: Diagnosis not present

## 2022-07-01 DIAGNOSIS — N201 Calculus of ureter: Secondary | ICD-10-CM

## 2022-07-01 DIAGNOSIS — I10 Essential (primary) hypertension: Secondary | ICD-10-CM | POA: Diagnosis not present

## 2022-07-01 DIAGNOSIS — R310 Gross hematuria: Secondary | ICD-10-CM | POA: Diagnosis not present

## 2022-07-01 DIAGNOSIS — F418 Other specified anxiety disorders: Secondary | ICD-10-CM | POA: Diagnosis not present

## 2022-07-02 ENCOUNTER — Other Ambulatory Visit: Payer: Self-pay | Admitting: Urology

## 2022-07-02 ENCOUNTER — Ambulatory Visit: Payer: BC Managed Care – PPO | Admitting: Urology

## 2022-07-02 DIAGNOSIS — N201 Calculus of ureter: Secondary | ICD-10-CM | POA: Diagnosis not present

## 2022-07-03 ENCOUNTER — Encounter (HOSPITAL_COMMUNITY): Payer: Self-pay | Admitting: Urology

## 2022-07-03 ENCOUNTER — Other Ambulatory Visit: Payer: Self-pay

## 2022-07-03 NOTE — Anesthesia Preprocedure Evaluation (Addendum)
Anesthesia Evaluation  Patient identified by MRN, date of birth, ID band Patient awake    Reviewed: Allergy & Precautions, H&P , NPO status , Patient's Chart, lab work & pertinent test results  Airway Mallampati: II  TM Distance: >3 FB Neck ROM: Full    Dental no notable dental hx. (+) Teeth Intact, Dental Advisory Given   Pulmonary sleep apnea and Continuous Positive Airway Pressure Ventilation , former smoker   Pulmonary exam normal breath sounds clear to auscultation       Cardiovascular Exercise Tolerance: Good hypertension, Pt. on medications + Peripheral Vascular Disease   Rhythm:Regular Rate:Normal     Neuro/Psych negative neurological ROS  negative psych ROS   GI/Hepatic negative GI ROS, Neg liver ROS,,,  Endo/Other    Morbid obesity  Renal/GU Renal disease  negative genitourinary   Musculoskeletal   Abdominal   Peds  Hematology negative hematology ROS (+)   Anesthesia Other Findings   Reproductive/Obstetrics negative OB ROS                             Anesthesia Physical Anesthesia Plan  ASA: 3  Anesthesia Plan: General   Post-op Pain Management: Tylenol PO (pre-op)*   Induction: Intravenous  PONV Risk Score and Plan: 3 and Ondansetron, Dexamethasone and Midazolam  Airway Management Planned: LMA  Additional Equipment:   Intra-op Plan:   Post-operative Plan: Extubation in OR  Informed Consent: I have reviewed the patients History and Physical, chart, labs and discussed the procedure including the risks, benefits and alternatives for the proposed anesthesia with the patient or authorized representative who has indicated his/her understanding and acceptance.     Dental advisory given  Plan Discussed with: CRNA  Anesthesia Plan Comments:        Anesthesia Quick Evaluation

## 2022-07-03 NOTE — Progress Notes (Signed)
For Anesthesia: PCP - Merri Brunette, MD, Lauretta Chester P.A  Cardiologist - Vascular- Barrett, Rae Roam, PA-C  Pulmonary-Olalere, Adewale A, MD   Chest x-ray - N/A EKG - greater than 1 year in epic Stress Test - greater than 2 years in epic ECHO - N/A Cardiac Cath - greater than 2 years in epic Pacemaker/ICD device last checked: N/A Pacemaker orders received: N/A Device Rep notified: N/A  Spinal Cord Stimulator: N/A  Sleep Study - Yes CPAP - Yes  Fasting Blood Sugar - N/A Checks Blood Sugar __N/A___ times a day Date and result of last Hgb A1c-N/A  Last dose of GLP1 agonist- N/A GLP1 instructions: N/A  Last dose of SGLT-2 inhibitors- N/A SGLT-2 instructions:N/A  Blood Thinner Instructions: Aspirin Instructions: Last Dose:  Activity level: Can go up a flight of stairs and activities of daily living without stopping and without chest pain and/or shortness of breath    Anesthesia review: 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   Patient denies shortness of breath, fever, cough and chest pain at PAT appointment   Patient verbalized understanding of instructions reviewed via telephone.

## 2022-07-04 ENCOUNTER — Ambulatory Visit (HOSPITAL_COMMUNITY): Payer: BC Managed Care – PPO | Admitting: Anesthesiology

## 2022-07-04 ENCOUNTER — Ambulatory Visit (HOSPITAL_COMMUNITY)
Admission: RE | Admit: 2022-07-04 | Discharge: 2022-07-04 | Disposition: A | Discharge status: Home / Self Care | Payer: BC Managed Care – PPO | Attending: Urology | Admitting: Urology

## 2022-07-04 ENCOUNTER — Ambulatory Visit (HOSPITAL_COMMUNITY): Payer: BC Managed Care – PPO

## 2022-07-04 ENCOUNTER — Encounter (HOSPITAL_COMMUNITY): Payer: Self-pay | Admitting: Urology

## 2022-07-04 ENCOUNTER — Encounter (HOSPITAL_COMMUNITY)
Admission: RE | Disposition: A | Discharge status: Home / Self Care | Payer: Self-pay | Source: Home / Self Care | Attending: Urology

## 2022-07-04 DIAGNOSIS — N200 Calculus of kidney: Secondary | ICD-10-CM

## 2022-07-04 DIAGNOSIS — Z6841 Body Mass Index (BMI) 40.0 and over, adult: Secondary | ICD-10-CM | POA: Diagnosis not present

## 2022-07-04 DIAGNOSIS — G4730 Sleep apnea, unspecified: Secondary | ICD-10-CM | POA: Diagnosis not present

## 2022-07-04 DIAGNOSIS — N201 Calculus of ureter: Secondary | ICD-10-CM | POA: Diagnosis not present

## 2022-07-04 DIAGNOSIS — I10 Essential (primary) hypertension: Secondary | ICD-10-CM | POA: Insufficient documentation

## 2022-07-04 DIAGNOSIS — I739 Peripheral vascular disease, unspecified: Secondary | ICD-10-CM | POA: Diagnosis not present

## 2022-07-04 DIAGNOSIS — N400 Enlarged prostate without lower urinary tract symptoms: Secondary | ICD-10-CM | POA: Insufficient documentation

## 2022-07-04 DIAGNOSIS — Z87891 Personal history of nicotine dependence: Secondary | ICD-10-CM | POA: Diagnosis not present

## 2022-07-04 DIAGNOSIS — N202 Calculus of kidney with calculus of ureter: Secondary | ICD-10-CM

## 2022-07-04 DIAGNOSIS — N132 Hydronephrosis with renal and ureteral calculous obstruction: Secondary | ICD-10-CM | POA: Diagnosis not present

## 2022-07-04 HISTORY — PX: CYSTOSCOPY/URETEROSCOPY/HOLMIUM LASER/STENT PLACEMENT: SHX6546

## 2022-07-04 HISTORY — DX: Aortic aneurysm of unspecified site, without rupture: I71.9

## 2022-07-04 HISTORY — DX: Aneurysm of the ascending aorta, without rupture: I71.21

## 2022-07-04 SURGERY — CYSTOSCOPY/URETEROSCOPY/HOLMIUM LASER/STENT PLACEMENT
Anesthesia: General | Laterality: Left

## 2022-07-04 MED ORDER — PROPOFOL 10 MG/ML IV BOLUS
INTRAVENOUS | Status: DC | PRN
  Administered 2022-07-04: 200 mg via INTRAVENOUS

## 2022-07-04 MED ORDER — DEXMEDETOMIDINE HCL IN NACL 80 MCG/20ML IV SOLN
INTRAVENOUS | Status: AC
  Filled 2022-07-04: qty 20

## 2022-07-04 MED ORDER — MIDAZOLAM HCL 2 MG/2ML IJ SOLN
INTRAMUSCULAR | Status: AC
  Filled 2022-07-04: qty 2

## 2022-07-04 MED ORDER — ACETAMINOPHEN 500 MG PO TABS
1000.0000 mg | ORAL_TABLET | Freq: Once | ORAL | Status: AC
  Administered 2022-07-04: 1000 mg via ORAL
  Filled 2022-07-04: qty 2

## 2022-07-04 MED ORDER — EPHEDRINE 5 MG/ML INJ
INTRAVENOUS | Status: AC
  Filled 2022-07-04: qty 5

## 2022-07-04 MED ORDER — DEXAMETHASONE SODIUM PHOSPHATE 10 MG/ML IJ SOLN
INTRAMUSCULAR | Status: AC
  Filled 2022-07-04: qty 1

## 2022-07-04 MED ORDER — PHENAZOPYRIDINE HCL 200 MG PO TABS: 200.0000 mg | ORAL_TABLET | Freq: Three times a day (TID) | ORAL | 0 refills | Status: AC | PRN

## 2022-07-04 MED ORDER — LIDOCAINE HCL (PF) 2 % IJ SOLN
INTRAMUSCULAR | Status: AC
  Filled 2022-07-04: qty 5

## 2022-07-04 MED ORDER — ONDANSETRON HCL 4 MG/2ML IJ SOLN
INTRAMUSCULAR | Status: DC | PRN
  Administered 2022-07-04: 4 mg via INTRAVENOUS

## 2022-07-04 MED ORDER — 0.9 % SODIUM CHLORIDE (POUR BTL) OPTIME
TOPICAL | Status: DC | PRN
  Administered 2022-07-04: 1000 mL

## 2022-07-04 MED ORDER — FENTANYL CITRATE (PF) 100 MCG/2ML IJ SOLN
INTRAMUSCULAR | Status: AC
  Filled 2022-07-04: qty 2

## 2022-07-04 MED ORDER — FENTANYL CITRATE (PF) 100 MCG/2ML IJ SOLN
INTRAMUSCULAR | Status: DC | PRN
  Administered 2022-07-04: 50 ug via INTRAVENOUS

## 2022-07-04 MED ORDER — PROPOFOL 10 MG/ML IV BOLUS
INTRAVENOUS | Status: AC
  Filled 2022-07-04: qty 20

## 2022-07-04 MED ORDER — KETOROLAC TROMETHAMINE 30 MG/ML IJ SOLN
INTRAMUSCULAR | Status: DC | PRN
  Administered 2022-07-04: 30 mg via INTRAVENOUS

## 2022-07-04 MED ORDER — KETOROLAC TROMETHAMINE 30 MG/ML IJ SOLN
INTRAMUSCULAR | Status: AC
  Filled 2022-07-04: qty 1

## 2022-07-04 MED ORDER — DEXAMETHASONE SODIUM PHOSPHATE 10 MG/ML IJ SOLN
INTRAMUSCULAR | Status: DC | PRN
  Administered 2022-07-04: 10 mg via INTRAVENOUS

## 2022-07-04 MED ORDER — LACTATED RINGERS IV SOLN: INTRAVENOUS | Status: DC

## 2022-07-04 MED ORDER — FENTANYL CITRATE PF 50 MCG/ML IJ SOSY: 25.0000 ug | PREFILLED_SYRINGE | INTRAMUSCULAR | Status: DC | PRN

## 2022-07-04 MED ORDER — ONDANSETRON HCL 4 MG/2ML IJ SOLN
INTRAMUSCULAR | Status: AC
  Filled 2022-07-04: qty 2

## 2022-07-04 MED ORDER — CIPROFLOXACIN IN D5W 400 MG/200ML IV SOLN
400.0000 mg | INTRAVENOUS | Status: AC
  Administered 2022-07-04: 400 mg via INTRAVENOUS
  Filled 2022-07-04: qty 200

## 2022-07-04 MED ORDER — DEXMEDETOMIDINE HCL IN NACL 80 MCG/20ML IV SOLN
INTRAVENOUS | Status: DC | PRN
  Administered 2022-07-04 (×3): 4 ug via BUCCAL

## 2022-07-04 MED ORDER — MIDAZOLAM HCL 5 MG/5ML IJ SOLN
INTRAMUSCULAR | Status: DC | PRN
  Administered 2022-07-04: 2 mg via INTRAVENOUS

## 2022-07-04 MED ORDER — LIDOCAINE 2% (20 MG/ML) 5 ML SYRINGE
INTRAMUSCULAR | Status: DC | PRN
  Administered 2022-07-04: 100 mg via INTRAVENOUS

## 2022-07-04 MED ORDER — CHLORHEXIDINE GLUCONATE 0.12 % MT SOLN
15.0000 mL | Freq: Once | OROMUCOSAL | Status: AC
  Administered 2022-07-04: 15 mL via OROMUCOSAL

## 2022-07-04 MED ORDER — EPHEDRINE SULFATE-NACL 50-0.9 MG/10ML-% IV SOSY
PREFILLED_SYRINGE | INTRAVENOUS | Status: DC | PRN
  Administered 2022-07-04 (×2): 5 mg via INTRAVENOUS
  Administered 2022-07-04: 10 mg via INTRAVENOUS

## 2022-07-04 MED ORDER — SODIUM CHLORIDE 0.9 % IR SOLN
Status: DC | PRN
  Administered 2022-07-04 (×2): 3000 mL via INTRAVESICAL

## 2022-07-04 MED ORDER — IOHEXOL 300 MG/ML  SOLN
INTRAMUSCULAR | Status: DC | PRN
  Administered 2022-07-04: 10 mL

## 2022-07-04 MED ORDER — ORAL CARE MOUTH RINSE: 15.0000 mL | Freq: Once | OROMUCOSAL | Status: AC

## 2022-07-04 SURGICAL SUPPLY — 22 items
BAG URO CATCHER STRL LF (MISCELLANEOUS) ×1 IMPLANT
BASKET ZERO TIP NITINOL 2.4FR (BASKET) IMPLANT
BSKT STON RTRVL ZERO TP 2.4FR (BASKET)
CATH URETL OPEN 5X70 (CATHETERS) ×1 IMPLANT
CLOTH BEACON ORANGE TIMEOUT ST (SAFETY) ×1 IMPLANT
EXTRACTOR STONE 1.7FRX115CM (UROLOGICAL SUPPLIES) IMPLANT
GLOVE SURG LX STRL 7.5 STRW (GLOVE) ×1 IMPLANT
GOWN STRL REUS W/ TWL XL LVL3 (GOWN DISPOSABLE) ×1 IMPLANT
GOWN STRL REUS W/TWL XL LVL3 (GOWN DISPOSABLE) ×1
GUIDEWIRE ANG ZIPWIRE 038X150 (WIRE) IMPLANT
GUIDEWIRE STR DUAL SENSOR (WIRE) ×1 IMPLANT
KIT TURNOVER KIT A (KITS) IMPLANT
LASER FIB FLEXIVA PULSE ID 365 (Laser) IMPLANT
MANIFOLD NEPTUNE II (INSTRUMENTS) ×1 IMPLANT
PACK CYSTO (CUSTOM PROCEDURE TRAY) ×1 IMPLANT
SHEATH NAVIGATOR HD 12/14X28 (SHEATH) IMPLANT
SHEATH NAVIGATOR HD 12/14X36 (SHEATH) IMPLANT
STENT URET 6FRX26 CONTOUR (STENTS) IMPLANT
TRACTIP FLEXIVA PULS ID 200XHI (Laser) IMPLANT
TRACTIP FLEXIVA PULSE ID 200 (Laser) ×1
TUBING CONNECTING 10 (TUBING) ×1 IMPLANT
TUBING UROLOGY SET (TUBING) ×1 IMPLANT

## 2022-07-04 NOTE — Discharge Instructions (Signed)
DISCHARGE INSTRUCTIONS FOR KIDNEY STONE/URETERAL STENT   MEDICATIONS:  1.  Resume all your other meds from home - except do not take any extra narcotic pain meds that you may have at home.  2. Pyridium is to help with the burning/stinging when you urinate. 3. Tramadol is for moderate/severe pain, otherwise taking upto 1000 mg every 6 hours of plainTylenol will help treat your pain.   4. Continue abx as prescribed.  ACTIVITY:  1. No strenuous activity x 1week  2. No driving while on narcotic pain medications  3. Drink plenty of water  4. Continue to walk at home - you can still get blood clots when you are at home, so keep active, but don't over do it.  5. May return to work/school tomorrow or when you feel ready   BATHING:  1. You can shower and we recommend daily showers  2. You have a string coming from your urethra: The stent string is attached to your ureteral stent. Do not pull on this.   SIGNS/SYMPTOMS TO CALL:  Please call us if you have a fever greater than 101.5, uncontrolled nausea/vomiting, uncontrolled pain, dizziness, unable to urinate, bloody urine, chest pain, shortness of breath, leg swelling, leg pain, redness around wound, drainage from wound, or any other concerns or questions.   You can reach [place] at [phone].   FOLLOW-UP:  1. You have an appointment in 6 weeks with a ultrasound of your kidneys prior.  removal in 1 week. 2. You have a string attached to your stent, you may remove it on April 15th. To do this, pull the strings until the stents are completely removed. You may feel an odd sensation in your back.

## 2022-07-04 NOTE — Op Note (Signed)
Preoperative diagnosis: left ureteral calculus  Postoperative diagnosis: left ureteral calculus  Procedure:  Cystoscopy left ureteroscopy and stone removal Ureteroscopic laser lithotripsy left 68F x 26cm ureteral stent placement  left retrograde pyelography with interpretation  Surgeon: Crist Fat, MD  Anesthesia: General  Complications: None  Intraoperative findings:  #1: left retrograde pyelography demonstrated a filling defect within the left ureter consistent with the patient's known calculus without other abnormalities. #2:  There is a filling defect in the distal ureter as well as in the upper pole of the left kidney.  These turned out both to be stones. #3: All stones were fragmented, basketed, and removed.  EBL: Minimal  Specimens: left ureteral calculus  Disposition of specimens: [facility] for stone analysis  Indication: Antonio Watts is a 62 y.o.   patient with a left ureteral stone and associated left symptoms. After reviewing the management options for treatment, the patient elected to proceed with the above surgical procedure(s). We have discussed the potential benefits and risks of the procedure, side effects of the proposed treatment, the likelihood of the patient achieving the goals of the procedure, and any potential problems that might occur during the procedure or recuperation. Informed consent has been obtained.   Description of procedure:  The patient was taken to the operating room and general anesthesia was induced.  The patient was placed in the dorsal lithotomy position, prepped and draped in the usual sterile fashion, and preoperative antibiotics were administered. A preoperative time-out was performed.   Cystourethroscopy was performed.  The patient's urethra was examined and was normal demonstrated bilobar prostatic hypertrophy. The bladder was then systematically examined in its entirety. There was no evidence for any  bladder tumors, stones, or other mucosal pathology.    Attention then turned to the left ureteral orifice and a ureteral catheter was used to intubate the ureteral orifice.  Omnipaque contrast was injected through the ureteral catheter and a retrograde pyelogram was performed with findings as dictated above.  A 0.38 sensor guidewire was then advanced up the left ureter into the renal pelvis under fluoroscopic guidance. The 6 Fr semirigid ureteroscope was then advanced into the ureter next to the guidewire and the calculus in the distal ureter was identified.   The stone was then fragmented with the 365 micron holmium laser fiber on a setting of 0.6 and frequency of 6 Hz.   All stones were then removed from the ureter with an N-gage nitinol basket.  Reinspection of the ureter revealed no remaining visible stones or fragments.   I then advanced a second wire through the ureteroscope and into the left renal pelvis.  I subsequently advanced a 12/14 French medium sized ureteral access sheath up over the second wire and into the mid ureter removing the inner portion of the sheath and the second wire.  I then used the flexible ureteroscope to navigate up into the patient's left kidney.  Pyeloscopy was performed and there was a stone in the lower pole as well as several in the upper pole.  I removed the stone in the lower pole and fragmented it several times in the ureter so that it would fit through the sheath.  All the stones in the upper pole were similarly removed.  I reinspected the kidney to ensure there were no additional stone fragments.  I then slowly backed out the ureteroscope and the ureteral access sheath noting no significant ureteral trauma.  The wire was then backloaded through the cystoscope and a  ureteral stent was advance over the wire using Seldinger technique.  The stent was positioned appropriately under fluoroscopic and cystoscopic guidance.  The wire was then removed with an adequate  stent curl noted in the renal pelvis as well as in the bladder.  The bladder was then emptied and the procedure ended.  The patient appeared to tolerate the procedure well and without complications.  The patient was able to be awakened and transferred to the recovery unit in satisfactory condition.   Disposition: The tether of the stent was left on and secured to the ventral aspect of the patient's penis. Instructions for removing the stent have been provided to the patient. The patient has been scheduled for followup in 6 weeks with a renal ultrasound.

## 2022-07-04 NOTE — Anesthesia Postprocedure Evaluation (Signed)
Anesthesia Post Note  Patient: Antonio Watts  Procedure(s) Performed: CYSTOSCOPY LEFT URETEROSCOPY, HOLMIUM LASER LITHOTRIPSY, STONE EXTRACTION, AND LEFT URETERAL STENT PLACEMENT (Left)     Patient location during evaluation: PACU Anesthesia Type: General Level of consciousness: awake and alert Pain management: pain level controlled Vital Signs Assessment: post-procedure vital signs reviewed and stable Respiratory status: spontaneous breathing, nonlabored ventilation and respiratory function stable Cardiovascular status: blood pressure returned to baseline and stable Postop Assessment: no apparent nausea or vomiting Anesthetic complications: no  No notable events documented.  Last Vitals:  Vitals:   07/04/22 1030 07/04/22 1045  BP: 115/67 113/71  Pulse: 77 66  Resp: 15 13  Temp:    SpO2: 96% 98%    Last Pain:  Vitals:   07/04/22 1045  TempSrc:   PainSc: 0-No pain                 Fabyan Loughmiller,W. EDMOND

## 2022-07-04 NOTE — Anesthesia Procedure Notes (Signed)
Procedure Name: LMA Insertion Date/Time: 07/04/2022 8:38 AM  Performed by: Arilyn Brierley D, CRNAPre-anesthesia Checklist: Patient identified, Emergency Drugs available, Suction available and Patient being monitored Patient Re-evaluated:Patient Re-evaluated prior to induction Oxygen Delivery Method: Circle system utilized Preoxygenation: Pre-oxygenation with 100% oxygen Induction Type: IV induction Ventilation: Mask ventilation without difficulty LMA: LMA inserted and LMA with gastric port inserted LMA Size: 4.0 Tube type: Oral Number of attempts: 1 Placement Confirmation: positive ETCO2 and breath sounds checked- equal and bilateral Tube secured with: Tape Dental Injury: Teeth and Oropharynx as per pre-operative assessment

## 2022-07-04 NOTE — Interval H&P Note (Signed)
History and Physical Interval Note:  07/04/2022 8:35 AM  Antonio Watts  has presented today for surgery, with the diagnosis of LEFT PROXIMAL URETERAL CALCULI.  The various methods of treatment have been discussed with the patient and family. After consideration of risks, benefits and other options for treatment, the patient has consented to  Procedure(s) with comments: CYSTOSCOPY LEFT URETEROSCOPY, HOLMIUM LASER LITHOTRIPSY, STONE EXTRACTION, AND LEFT URETERAL STENT PLACEMENT (Left) - 60 MINUTES as a surgical intervention.  The patient's history has been reviewed, patient examined, no change in status, stable for surgery.  I have reviewed the patient's chart and labs.  Questions were answered to the patient's satisfaction.     Crist Fat

## 2022-07-04 NOTE — H&P (Signed)
Acute Kidney Stone  HPI: Antonio Watts is a 62 year-old male patient who was referred by Lauretta Chester, NP who is here for further eval and management of kidney stones.  He was diagnosed with a kidney stone on 06/28/2022. The patient presented to [facility] with symptoms of a kidney stone.   His pain started about 06/28/2022.   Abdomen/Pelvic CT: 06/28/22: 9 mm left UPJ calculus with mild hydronephrosis; bilateral nephrolithiasis. The patient underwent CT scan prior to today's appointment.   The patient relates initially having voiding symptoms. He is currently having flank pain and back pain. He denies having groin pain, nausea, vomiting, fever, chills, and voiding symptoms. He has been taking sulfamethoxazole, ondansetron, hydrocodone-acetaminophen. He has not caught a stone in his urine strainer since his symptoms began.   This is not his first kidney stone.   Presented to the ED with bloody urine without clots.   The patient was sent to the emergency department initially because of gross hematuria. In the emergency department he started developing left-sided flank pain. Denies any fevers or chills. The hematuria has resolved. Patient is otherwise reasonably healthy. 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     ALLERGIES: Doxycycline    MEDICATIONS: Amlodipine Besylate 5 mg tablet  Diphenhydramine Hcl  Escitalopram Oxalate 5 mg tablet  Gabapentin 300 mg capsule  Hydrocodone-Acetaminophen 5 mg-325 mg tablet  Ibuprofen 600 mg tablet  Melatonin  Meloxicam 15 mg tablet  Multivitamin  Ondansetron Hcl 8 mg tablet  Sulfamethoxazole-Trimethoprim 800 mg-160 mg tablet  Vitamin B Complex     GU PSH: Locm 300-399Mg /Ml Iodine,1Ml - 2018     NON-GU PSH: None   GU PMH: Benign cyst of testis - 2018, - 2018 Ureteral calculus - 2018, - 2018 Gross hematuria - 2018 Bladder-neck stenosis/contracture, Bladder neck  contracture - 2014 History of urolithiasis, Nephrolithiasis - 2014      PMH Notes:  1898-03-25 00:00:00 - Note: Normal Routine History And Physical Adult   NON-GU PMH: Anxiety Hypertension Sleep Apnea    FAMILY HISTORY: Death In The Family Father - Father Gastric Cancer - Grandmother Heart Disease - Grandfather pulmonary fibrosis - Mother   SOCIAL HISTORY: Marital Status: Married Preferred Language: English; Ethnicity: Not Hispanic Or Latino; Race: White Current Smoking Status: Patient does not smoke anymore. Has not smoked since 06/24/2010. Smoked for 33 years. Smoked 2 packs per day.   Tobacco Use Assessment Completed: Used Tobacco in last 30 days? Has never drank.  Does not drink caffeine. Patient's occupation Software engineer.    REVIEW OF SYSTEMS:    GU Review Male:   Patient reports get up at night to urinate, stream starts and stops, and trouble starting your stream. Patient denies frequent urination, hard to postpone urination, burning/ pain with urination, leakage of urine, have to strain to urinate , erection problems, and penile pain.  Gastrointestinal (Upper):   Patient reports nausea and indigestion/ heartburn. Patient denies vomiting.  Gastrointestinal (Lower):   Patient denies diarrhea and constipation.  Constitutional:   Patient reports fatigue. Patient denies fever, night sweats, and weight loss.  Skin:   Patient denies skin rash/ lesion and itching.  Eyes:   Patient denies blurred vision and double vision.  Ears/ Nose/ Throat:   Patient reports sinus problems. Patient denies sore throat.  Hematologic/Lymphatic:   Patient denies swollen glands and easy bruising.  Cardiovascular:   Patient denies leg swelling and  chest pains.  Respiratory:   Patient denies cough and shortness of breath.  Endocrine:   Patient denies excessive thirst.  Musculoskeletal:   Patient denies back pain and joint pain.  Neurological:   Patient denies headaches and dizziness.   Psychologic:   Patient reports anxiety. Patient denies depression.   Notes: Gross hematuria, history of injury to kidneys/bladder    VITAL SIGNS:      07/02/2022 09:34 AM  Weight 293 lb / 132.9 kg  Height 69 in / 175.26 cm  BP 111/69 mmHg  Pulse 58 /min  Temperature 98.3 F / 36.8 C  BMI 43.3 kg/m   MULTI-SYSTEM PHYSICAL EXAMINATION:    Constitutional: Well-nourished. No physical deformities. Normally developed. Good grooming.  Neck: Neck symmetrical, not swollen. Normal tracheal position.  Respiratory: Normal breath sounds. No labored breathing, no use of accessory muscles.   Cardiovascular: Regular rate and rhythm. No murmur, no gallop. Normal temperature, normal extremity pulses, no swelling, no varicosities.   Lymphatic: No enlargement of neck, axillae, groin.  Skin: No paleness, no jaundice, no cyanosis. No lesion, no ulcer, no rash.  Neurologic / Psychiatric: Oriented to time, oriented to place, oriented to person. No depression, no anxiety, no agitation.  Gastrointestinal: No mass, no tenderness, no rigidity, non obese abdomen.  Eyes: Normal conjunctivae. Normal eyelids.  Ears, Nose, Mouth, and Throat: Left ear no scars, no lesions, no masses. Right ear no scars, no lesions, no masses. Nose no scars, no lesions, no masses. Normal hearing. Normal lips.  Musculoskeletal: Normal gait and station of head and neck.     Complexity of Data:  Source Of History:  Patient  Records Review:   Previous Doctor Records, Previous Patient Records, POC Tool  Urine Test Review:   Urinalysis  X-Ray Review: C.T. Abdomen/Pelvis: Reviewed Films. Discussed With Patient.     08/12/16  PSA  Total PSA 0.20     PROCEDURES:         KUB - [id number]  A single view of the abdomen is obtained. Renal shadows are easily visualized bilaterally. There are no stones appreciated within the expected location in either renal pelvis. There are no additional calcifications along the expected location of either  ureter bilaterally.  Gas pattern is grossly normal. No significant bony abnormalities.      Stones do not appear to be visible on today's x-ray.          Urinalysis w/Scope Dipstick Dipstick Cont'd Micro  Color: Yellow Bilirubin: Neg mg/dL WBC/hpf: 0 - 5/hpf  Appearance: Clear Ketones: Neg mg/dL RBC/hpf: 20 - 02/OVZ  Specific Gravity: 1.025 Blood: 2+ ery/uL Bacteria: Few (10-25/hpf)  pH: 6.0 Protein: Neg mg/dL Cystals: NS (Not Seen)  Glucose: Neg mg/dL Urobilinogen: 0.2 mg/dL Casts: NS (Not Seen)    Nitrites: Neg Trichomonas: Not Present    Leukocyte Esterase: Neg leu/uL Mucous: Present      Epithelial Cells: NS (Not Seen)      Yeast: NS (Not Seen)      Sperm: Not Present    ASSESSMENT:      ICD-10 Details  1 GU:   Ureteral calculus - N20.1    PLAN:            Medications New Meds: Tramadol Hcl 50 mg tablet 1-2 tablet PO Q 6 H PRN   #20  0 Refill(s)  Pharmacy Name:  [id number]  Address[address]  [address] [place], [place] [address]  Phone:  [phone]  Fax:  [phone]  Stop Meds: Hydrocodone-Acetaminophen 5 mg-325 mg tablet 1 tablet PO Q 6 H PRN  Start: 09/02/2016  Discontinue: 07/02/2022  - Reason: The medication cycle was completed.            Orders X-Rays: KUB          Schedule         Document Letter(s):  Created for Patient: Clinical Summary         Notes:   The patient has what appears to be 2 small left proximal ureteral stones with a additional smaller nonobstructing stone in the left lower pole. Unfortunately these are not visible on plain x-ray, as such I have recommended again shockwave lithotripsy. Instead we talked about medical expulsion therapy or ureteroscopy. 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 As such, I recommended that we do proceed with ureteroscopy. We discussed the procedure with him in detail including the  fact that following the procedure to have a stent for short period of time. Dr. Mena GoesEskridge is a former urologist, and in between he and I we will try to find the earliest time to get this done.

## 2022-07-04 NOTE — Transfer of Care (Signed)
Immediate Anesthesia Transfer of Care Note  Patient: Antonio Watts  Procedure(s) Performed: CYSTOSCOPY LEFT URETEROSCOPY, HOLMIUM LASER LITHOTRIPSY, STONE EXTRACTION, AND LEFT URETERAL STENT PLACEMENT (Left)  Patient Location: PACU  Anesthesia Type:General  Level of Consciousness: awake, alert , and oriented  Airway & Oxygen Therapy: Patient Spontanous Breathing and Patient connected to nasal cannula oxygen  Post-op Assessment: Report given to RN and Post -op Vital signs reviewed and stable  Post vital signs: Reviewed and stable  Last Vitals:  Vitals Value Taken Time  BP    Temp    Pulse 85 07/04/22 1019  Resp 18 07/04/22 1019  SpO2 94 % 07/04/22 1019  Vitals shown include unvalidated device data.  Last Pain:  Vitals:   07/04/22 0703  TempSrc:   PainSc: 8          Complications: No notable events documented.

## 2022-07-05 ENCOUNTER — Encounter (HOSPITAL_COMMUNITY): Payer: Self-pay | Admitting: Urology

## 2022-07-11 LAB — CALCULI, WITH PHOTOGRAPH (CLINICAL LAB)
Calcium Oxalate Monohydrate: 20 %
Uric Acid Calculi: 80 %
Weight Calculi: 121 mg

## 2022-07-22 DIAGNOSIS — G4733 Obstructive sleep apnea (adult) (pediatric): Secondary | ICD-10-CM | POA: Diagnosis not present

## 2022-07-29 DIAGNOSIS — N200 Calculus of kidney: Secondary | ICD-10-CM | POA: Diagnosis not present

## 2022-07-29 DIAGNOSIS — I10 Essential (primary) hypertension: Secondary | ICD-10-CM | POA: Diagnosis not present

## 2022-08-15 DIAGNOSIS — N202 Calculus of kidney with calculus of ureter: Secondary | ICD-10-CM | POA: Diagnosis not present

## 2022-08-21 DIAGNOSIS — G4733 Obstructive sleep apnea (adult) (pediatric): Secondary | ICD-10-CM | POA: Diagnosis not present

## 2022-09-21 DIAGNOSIS — G4733 Obstructive sleep apnea (adult) (pediatric): Secondary | ICD-10-CM | POA: Diagnosis not present

## 2022-10-21 DIAGNOSIS — G4733 Obstructive sleep apnea (adult) (pediatric): Secondary | ICD-10-CM | POA: Diagnosis not present

## 2022-11-08 DIAGNOSIS — I700 Atherosclerosis of aorta: Secondary | ICD-10-CM | POA: Diagnosis not present

## 2022-11-22 DIAGNOSIS — I7121 Aneurysm of the ascending aorta, without rupture: Secondary | ICD-10-CM | POA: Diagnosis not present

## 2022-11-22 DIAGNOSIS — N200 Calculus of kidney: Secondary | ICD-10-CM | POA: Diagnosis not present

## 2022-11-22 DIAGNOSIS — I10 Essential (primary) hypertension: Secondary | ICD-10-CM | POA: Diagnosis not present

## 2022-11-22 DIAGNOSIS — I700 Atherosclerosis of aorta: Secondary | ICD-10-CM | POA: Diagnosis not present

## 2022-12-03 ENCOUNTER — Other Ambulatory Visit: Payer: Self-pay | Admitting: Cardiothoracic Surgery

## 2022-12-03 DIAGNOSIS — I7121 Aneurysm of the ascending aorta, without rupture: Secondary | ICD-10-CM

## 2023-01-02 ENCOUNTER — Other Ambulatory Visit: Payer: BC Managed Care – PPO

## 2023-01-06 ENCOUNTER — Ambulatory Visit: Payer: BC Managed Care – PPO

## 2023-01-08 NOTE — Progress Notes (Signed)
[address]       Antonio Watts [address]             [phone]     PCP is Merri Brunette, MD Referring Provider is Merri Brunette, MD  Chief Complaint:Ascending thoracic aortic aneurysm   HPI: This is a 62 year old male with a past medical history of hypertension, kidney stones, OSA, heartburn, and remote tobacco abuse who presents today for further surveillance of his incidentally finding of an ascending thoracic aortic aneurysm. He was last seen by my colleague in April 2024. At that time, the ATAA measured 4.5 cm. Patient denies chest pain, pressure, or tightness. His only complaint is intermittent numbness between left thumb and first finger.  Past Medical History:  Diagnosis Date   Allergy    seasonal   Aortic aneurysm (HCC)    COVID-19 virus infection    03-2020, 10-2020   Heartburn    OCC- no meds   Hypertension    Kidney calculi    Kidney stones    Low testosterone    OSA (obstructive sleep apnea)    Sleep apnea    wears cpap   Syncope 06/29/2020   due to dehydration   Thoracic ascending aortic aneurysm (HCC)    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    Past Surgical History:  Procedure Laterality Date   COLONOSCOPY  09/18/2009   COLONOSCOPY WITH PROPOFOL  12/05/2020   Claudette Head   CYSTOSCOPY/URETEROSCOPY/HOLMIUM LASER/STENT PLACEMENT Left 07/04/2022   Procedure: CYSTOSCOPY LEFT URETEROSCOPY, HOLMIUM LASER LITHOTRIPSY, STONE EXTRACTION, AND LEFT URETERAL STENT PLACEMENT;  Surgeon: Crist Fat, MD;  Location: [facility];  Service: Urology;  Laterality: Left;  60 MINUTES    Family History  Problem Relation Age of Onset   Pulmonary fibrosis Mother    Cancer Other        fhx   Heart disease Other        fhx   Heart failure Other    Colon cancer Neg Hx    Colon polyps Neg Hx    Esophageal cancer Neg Hx    Stomach cancer Neg Hx    Rectal cancer Neg Hx     Social History Social History   Tobacco Use    Smoking status: Former    Current packs/day: 0.00    Average packs/day: 1 pack/day for 35.0 years (35.0 ttl pk-yrs)    Types: Cigarettes    Start date: 05/25/1976    Quit date: 05/26/2011    Years since quitting: 11.6   Smokeless tobacco: Never  Vaping Use   Vaping status: Never Used  Substance Use Topics   Alcohol use: No   Drug use: Not Currently    Current Outpatient Medications  Medication Sig Dispense Refill   amLODipine (NORVASC) 5 MG tablet Take 5 mg by mouth daily.     aspirin EC 81 MG tablet Take 81 mg by mouth daily. Swallow whole.     b complex vitamins tablet Take 1 tablet by mouth daily.     cetirizine (ZYRTEC) 10 MG tablet Take 10 mg by mouth daily.     escitalopram (LEXAPRO) 5 MG tablet Take 5 mg by mouth daily.     gabapentin (NEURONTIN) 300 MG capsule TAKE 1 CAPSULE(300 MG) BY MOUTH THREE TIMES DAILY (Patient taking differently: Take 300 mg by mouth at bedtime. 100 mg cap) 90 capsule 0   lisinopril (ZESTRIL) 10 MG tablet TAKE 1 TABLET(10 MG) BY  MOUTH DAILY (Patient taking differently: Take 20 mg by mouth daily.) 90 tablet 2   MELATONIN PO Take 12 mg by mouth at bedtime.     meloxicam (MOBIC) 15 MG tablet TAKE 1 TABLET(15 MG) BY MOUTH DAILY 30 tablet 3   Multiple Vitamin (MULTIVITAMIN WITH MINERALS) TABS tablet Take 1 tablet by mouth daily.     NON FORMULARY CPAP at bedtime     ondansetron (ZOFRAN-ODT) 8 MG disintegrating tablet Take 1 tablet (8 mg total) by mouth every 8 (eight) hours as needed for nausea. 20 tablet 0   phenazopyridine (PYRIDIUM) 200 MG tablet Take 1 tablet (200 mg total) by mouth 3 (three) times daily as needed for pain. 10 tablet 0   rosuvastatin (CRESTOR) 10 MG tablet Take 10 mg by mouth at bedtime.     tamsulosin (FLOMAX) 0.4 MG CAPS capsule Take 0.4 mg by mouth at bedtime.     traMADol (ULTRAM) 50 MG tablet Take 50 mg by mouth.     Allergies  Allergen Reactions   Doxycycline Hyclate     REACTION: rash, swelling    Review of Systems:  Chest  Pain [  N] Exertional SOB [  N]  Pedal Edema Klaus.Mock  ] Syncope [ N ]  General Review of Systems: [Y] = yes [ N]=no  Consitutional:   nausea Klaus.Mock ];  fever [ N];  Resp: cough [ N];   GI: vomiting[ N]; melena[N ]; hematochezia [N];  YQ:MVHQIONGE[X ]; Heme/Lymph: anemia[ N];  Neuro: TIA[ N];stroke[N ];   Endocrine: diabetes[N ];   Vital Signs:  Vitals:   01/13/23 1420  BP: 121/74  Pulse: 61  Resp: 20  SpO2: 96%       Physical Exam: CV-RRR, no murmur Neck -No carotid bruit Pulmonary-Clear to auscultation bilaterally Abdomen-Soft, obese, non tender, bowel sounds present Extremities-No LE edema Neurologic-Grossly intact   Diagnostic Tests: Narrative & Impression  CLINICAL DATA:  Follow-up thoracic aortic aneurysm.   EXAM: CT ANGIOGRAPHY CHEST WITH CONTRAST   TECHNIQUE: Multidetector CT imaging of the chest was performed using the standard protocol during bolus administration of intravenous contrast. Multiplanar CT image reconstructions and MIPs were obtained to evaluate the vascular anatomy.   RADIATION DOSE REDUCTION: This exam was performed according to the departmental dose-optimization program which includes automated exposure control, adjustment of the mA and/or kV according to patient size and/or use of iterative reconstruction technique.   CONTRAST:  75mL ISOVUE-370 IOPAMIDOL (ISOVUE-370) INJECTION 76%   COMPARISON:  06/14/2022   FINDINGS: Cardiovascular: 4.3 cm ascending thoracic aortic aneurysm shows no significant change since previous study. No evidence of thoracic aortic dissection.   Mediastinum/Nodes: No masses or pathologically enlarged lymph nodes identified.   Lungs/Pleura: No pulmonary mass, infiltrate, or effusion.   Upper abdomen: No acute findings.   Musculoskeletal: No suspicious bone lesions identified.   Review of the MIP images confirms the above findings.   IMPRESSION: 4.3 cm ascending thoracic aortic aneurysm, without  significant change since previous study. Recommend continued annual imaging followup by CTA or MRA. This recommendation follows 2010 ACCF/AHA/AATS/ACR/ASA/SCA/SCAI/SIR/STS/SVM Guidelines for the Diagnosis and Management of Patients with Thoracic Aortic Disease. Circulation. 2010; 121: B284-X324. Aortic aneurysm NOS (ICD10-I71.9)     Electronically Signed   By: Danae Orleans M.D.   On: 01/10/2023 12:14    Impression and Plan: CTA with a 4.3  cm ascending aortic aneurysm.  We discussed the natural history and and risk factors for growth of ascending aortic aneurysms.  We covered the importance  of smoking cessation, tight blood pressure control, refraining from lifting heavy objects, and avoiding fluoroquinolones.  The patient is aware of signs and symptoms of aortic dissection and when to present to the emergency department.  We will continue surveillance and a repeat CTA wasordered for 6 months.     Ardelle Balls, PA-C [facility] [phone]

## 2023-01-10 ENCOUNTER — Ambulatory Visit
Admission: RE | Admit: 2023-01-10 | Discharge: 2023-01-10 | Disposition: A | Discharge status: Home / Self Care | Payer: BLUE CROSS/BLUE SHIELD | Source: Ambulatory Visit | Attending: Cardiothoracic Surgery | Admitting: Cardiothoracic Surgery

## 2023-01-10 DIAGNOSIS — I7121 Aneurysm of the ascending aorta, without rupture: Secondary | ICD-10-CM

## 2023-01-10 MED ORDER — IOPAMIDOL (ISOVUE-370) INJECTION 76%
200.0000 mL | Freq: Once | INTRAVENOUS | Status: AC | PRN
  Administered 2023-01-10: 75 mL via INTRAVENOUS

## 2023-01-13 ENCOUNTER — Encounter: Payer: Self-pay | Admitting: Physician Assistant

## 2023-01-13 ENCOUNTER — Ambulatory Visit: Payer: BLUE CROSS/BLUE SHIELD | Admitting: Physician Assistant

## 2023-01-13 VITALS — BP 121/74 | HR 61 | Resp 20 | Ht 69.0 in | Wt 305.0 lb

## 2023-01-13 DIAGNOSIS — I7121 Aneurysm of the ascending aorta, without rupture: Secondary | ICD-10-CM | POA: Diagnosis not present

## 2023-01-13 NOTE — Patient Instructions (Addendum)
Risk Modification in those with ascending thoracic aortic aneurysm:  Continue good control of blood pressure (prefer SBP 130/80 or less)-continue with Amlodipine and Lisinopril  2. Avoid fluoroquinolone antibiotics (I.e Ciprofloxacin, Avelox, Levofloxacin, Ofloxacin).  3.  Use of statin (to decrease cardiovascular risk)-continue with Crestor  4.  Exercise and activity limitations is individualized, but in general, contact sports are to be avoided and one should avoid heavy lifting (defined as half of ideal body weight) and exercises involving sustained Valsalva maneuver.  5. Counseling for those suspected of having genetically mediated disease. First-degree relatives of those with TAA disease should be screened as well as those who have a connective tissue disease (I.e with Marfan syndrome, Ehlers-Danlos syndrome,  and Loeys-Dietz syndrome) or a  bicuspid aortic valve,have an increased risk for complications related to TAA.  6. He has a remote history of tobacco abuse;quit 2013.

## 2023-05-30 ENCOUNTER — Other Ambulatory Visit: Payer: Self-pay | Admitting: Thoracic Surgery (Cardiothoracic Vascular Surgery)

## 2023-05-30 DIAGNOSIS — I7121 Aneurysm of the ascending aorta, without rupture: Secondary | ICD-10-CM

## 2023-06-27 NOTE — Patient Instructions (Signed)

## 2023-06-27 NOTE — Progress Notes (Unsigned)
 [address]       [place] [address]             [phone]        Antonio Watts [id number] 1960-08-28  History of Present Illness: Antonio Watts is a 63 year old male with a past medical history of hypertension, kidney stones, OSA, heartburn, and remote tobacco abuse. He was incidentally found to have a 4.8cm ascending aortic aneurysm on CT calcium scoring study. He then underwent CTA which measured 4.5cm at the ascending aorta and 4.2cm at the sinuses of valsalva. His aneurysm has remained stable on 6 month chest CTA scans.  He denies family history of ATAA and personal history of connective tissue disorder. He does admit to heart disease in his grandpa leading to several heart attacks.  Today he denies chest pain, shortness of breath, lower extremity edema, dizziness, and LOC. He admits that he has developed numbness of his left fingers more so in the last few months. He denies any other neurological symptoms.   Current Outpatient Medications on File Prior to Visit  Medication Sig Dispense Refill   amLODipine (NORVASC) 5 MG tablet Take 5 mg by mouth daily.     aspirin EC 81 MG tablet Take 81 mg by mouth daily. Swallow whole.     b complex vitamins tablet Take 1 tablet by mouth daily.     cetirizine (ZYRTEC) 10 MG tablet Take 10 mg by mouth daily.     escitalopram (LEXAPRO) 5 MG tablet Take 5 mg by mouth daily.     gabapentin (NEURONTIN) 300 MG capsule TAKE 1 CAPSULE(300 MG) BY MOUTH THREE TIMES DAILY (Patient taking differently: Take 300 mg by mouth at bedtime. 100 mg cap) 90 capsule 0   lisinopril (ZESTRIL) 10 MG tablet TAKE 1 TABLET(10 MG) BY MOUTH DAILY (Patient taking differently: Take 20 mg by mouth daily.) 90 tablet 2   MELATONIN PO Take 12 mg by mouth at bedtime.     meloxicam (MOBIC) 15 MG tablet TAKE 1 TABLET(15 MG) BY MOUTH DAILY 30 tablet 3   Multiple Vitamin (MULTIVITAMIN WITH MINERALS) TABS tablet Take 1 tablet by mouth daily.     NON FORMULARY  CPAP at bedtime     ondansetron (ZOFRAN-ODT) 8 MG disintegrating tablet Take 1 tablet (8 mg total) by mouth every 8 (eight) hours as needed for nausea. 20 tablet 0   phenazopyridine (PYRIDIUM) 200 MG tablet Take 1 tablet (200 mg total) by mouth 3 (three) times daily as needed for pain. 10 tablet 0   rosuvastatin (CRESTOR) 10 MG tablet Take 10 mg by mouth at bedtime.     tamsulosin (FLOMAX) 0.4 MG CAPS capsule Take 0.4 mg by mouth at bedtime.     traMADol (ULTRAM) 50 MG tablet Take 50 mg by mouth. (Patient not taking: Reported on 01/13/2023)     No current facility-administered medications on file prior to visit.   Vitals: Today's Vitals   07/10/23 1347  BP: 135/79  Pulse: 65  Resp: 20  SpO2: 97%  Weight: (!) 318 lb 6.4 oz (144.4 kg)  Height: 5\' 9"  (1.753 m)   Body mass index is 47.02 kg/m.  Physical Exam General: Alert and oriented, no acute distress Neuro: Grossly intact CV: Regular rate and rhythm, no murmur Pulm: Clear to auscultation bilaterally GI: Protuberant abdomen, nontender Extremities: No edema BLE, 2+ radial pulses bilaterally  CTA Results: CLINICAL DATA:  Aortic aneurysm follow-up   EXAM: CT ANGIOGRAPHY  CHEST WITH CONTRAST   TECHNIQUE: Multidetector CT imaging of the chest was performed using the standard protocol during bolus administration of intravenous contrast. Multiplanar CT image reconstructions and MIPs were obtained to evaluate the vascular anatomy.   RADIATION DOSE REDUCTION: This exam was performed according to the departmental dose-optimization program which includes automated exposure control, adjustment of the mA and/or kV according to patient size and/or use of iterative reconstruction technique.   CONTRAST:  75mL ISOVUE-370 IOPAMIDOL (ISOVUE-370) INJECTION 76%   COMPARISON:  Prior CTA chest 01/10/2023   FINDINGS: Cardiovascular: Conventional 3 vessel arch anatomy. Cardiac motion artifact significantly limits precise measurements of the  aortic root and ascending thoracic aorta. The aortic root is grossly normal in caliber at approximately 4.1 cm measured at the sinuses of Valsalva. No effacement of the sino-tubular junction. The ascending thoracic aorta measures a maximum of 4.4 cm in diameter, insignificantly changed compared to prior. Trace calcifications along the aorta and coronary arteries. The heart is normal in size. Main pulmonary artery is normal in caliber. No pericardial effusion.   Mediastinum/Nodes: Unremarkable CT appearance of the thyroid gland. No suspicious mediastinal or hilar adenopathy. No soft tissue mediastinal mass. The thoracic esophagus is unremarkable.   Lungs/Pleura: Stable subpleural lymph node along the right major fissure measuring approximately 6 mm, this is almost certainly benign. No suspicious mass or nodule. The lungs are otherwise clear. No pleural effusion or pneumothorax.   Upper Abdomen: Visualized upper abdominal organs are unremarkable.   Musculoskeletal: No acute fracture or aggressive appearing lytic or blastic osseous lesion.   Review of the MIP images confirms the above findings.   IMPRESSION: 1. Grossly stable mild aneurysmal dilation of the ascending thoracic aorta with a maximal diameter of 4.4 cm. Recommend annual imaging followup by CTA or MRA. This recommendation follows 2010 ACCF/AHA/AATS/ACR/ASA/SCA/SCAI/SIR/STS/SVM Guidelines for the Diagnosis and Management of Patients with Thoracic Aortic Disease. Circulation. 2010; 121: W098-J191. 2. Trace aortic and coronary artery atherosclerotic vascular calcifications. 3. No acute cardiopulmonary process.   Aortic aneurysm NOS (ICD10-I71.9); Aortic Atherosclerosis (ICD10-I70.0).     Electronically Signed   By: Fernando Hoyer M.D.   On: 07/03/2023 13:19   Impression and Plan: ATAA: Antonio Watts presents to the clinic with a stable 4.4 cm ascending aortic aneurysm. The patient has not undergone an  echocardiogram in the past but does not have a murmur on exam so I think we can hold off for now. We discussed the natural history and and risk factors for growth of ascending aortic aneurysms. We covered the importance of tight blood pressure control, refraining from lifting heavy objects, continued smoking cessation, and avoiding fluoroquinolones. The patient is aware of signs and symptoms of aortic dissection and when to present to the emergency department. He does not meet surgical criteria of 5.5cm at this time. We will continue surveillance, plan to have the the patient return to the clinic with chest CTA in 1 year since his aneurysm measures less than 4.5cm.   Aortic and coronary artery calcifications: We discussed diet and exercise. Patient is working on weight loss. On Rosuvastatin. Follow up with PCP.  HTN: BP controlled in clinic today. The patient reports he checks it at home and it is usually less than 130/80. Continue BP medications and follow up with PCP.    Risk Modification:  Statin:  Rosuvastatin  Smoking cessation instruction/counseling given:  previous smoker 10-12 years ago   Patient was counseled on importance of Blood Pressure Control.  Despite Medical intervention if the  patient notices persistently elevated blood pressure readings.  They are instructed to contact their Primary Care Physician  Please avoid use of Fluoroquinolones as this can potentially increase your risk of Aortic Rupture and/or Dissection  Patient educated on signs and symptoms of Aortic Dissection, handout also provided in AVS  Randa Burton, PA-C 06/27/23

## 2023-07-03 ENCOUNTER — Ambulatory Visit
Admission: RE | Admit: 2023-07-03 | Discharge: 2023-07-03 | Disposition: A | Discharge status: Home / Self Care | Payer: Self-pay | Source: Ambulatory Visit | Attending: Thoracic Surgery (Cardiothoracic Vascular Surgery)

## 2023-07-03 DIAGNOSIS — I7121 Aneurysm of the ascending aorta, without rupture: Secondary | ICD-10-CM

## 2023-07-03 MED ORDER — IOPAMIDOL (ISOVUE-370) INJECTION 76%
500.0000 mL | Freq: Once | INTRAVENOUS | Status: AC | PRN
  Administered 2023-07-03: 75 mL via INTRAVENOUS

## 2023-07-10 ENCOUNTER — Ambulatory Visit (INDEPENDENT_AMBULATORY_CARE_PROVIDER_SITE_OTHER): Payer: Self-pay | Admitting: Physician Assistant

## 2023-07-10 ENCOUNTER — Encounter: Payer: Self-pay | Admitting: Physician Assistant

## 2023-07-10 VITALS — BP 135/79 | HR 65 | Resp 20 | Ht 69.0 in | Wt 318.4 lb

## 2023-07-10 DIAGNOSIS — I712 Thoracic aortic aneurysm, without rupture, unspecified: Secondary | ICD-10-CM | POA: Insufficient documentation

## 2023-07-10 DIAGNOSIS — I7121 Aneurysm of the ascending aorta, without rupture: Secondary | ICD-10-CM | POA: Diagnosis not present

## 2024-01-27 ENCOUNTER — Encounter (HOSPITAL_BASED_OUTPATIENT_CLINIC_OR_DEPARTMENT_OTHER): Payer: Self-pay

## 2024-01-27 ENCOUNTER — Emergency Department (HOSPITAL_BASED_OUTPATIENT_CLINIC_OR_DEPARTMENT_OTHER)

## 2024-01-27 ENCOUNTER — Emergency Department (HOSPITAL_BASED_OUTPATIENT_CLINIC_OR_DEPARTMENT_OTHER): Admitting: Radiology

## 2024-01-27 ENCOUNTER — Other Ambulatory Visit: Payer: Self-pay

## 2024-01-27 ENCOUNTER — Emergency Department (HOSPITAL_BASED_OUTPATIENT_CLINIC_OR_DEPARTMENT_OTHER)
Admission: EM | Admit: 2024-01-27 | Discharge: 2024-01-27 | Disposition: A | Discharge status: Home / Self Care | Source: Ambulatory Visit | Attending: Emergency Medicine | Admitting: Emergency Medicine

## 2024-01-27 DIAGNOSIS — M25512 Pain in left shoulder: Secondary | ICD-10-CM | POA: Diagnosis not present

## 2024-01-27 DIAGNOSIS — Z7982 Long term (current) use of aspirin: Secondary | ICD-10-CM | POA: Diagnosis not present

## 2024-01-27 DIAGNOSIS — M5030 Other cervical disc degeneration, unspecified cervical region: Secondary | ICD-10-CM

## 2024-01-27 DIAGNOSIS — Z79899 Other long term (current) drug therapy: Secondary | ICD-10-CM | POA: Diagnosis not present

## 2024-01-27 DIAGNOSIS — M549 Dorsalgia, unspecified: Secondary | ICD-10-CM | POA: Diagnosis not present

## 2024-01-27 DIAGNOSIS — I10 Essential (primary) hypertension: Secondary | ICD-10-CM | POA: Diagnosis not present

## 2024-01-27 DIAGNOSIS — M898X1 Other specified disorders of bone, shoulder: Secondary | ICD-10-CM

## 2024-01-27 LAB — BASIC METABOLIC PANEL WITH GFR
Anion gap: 9 (ref 5–15)
BUN: 16 mg/dL (ref 8–23)
CO2: 25 mmol/L (ref 22–32)
Calcium: 10.4 mg/dL — ABNORMAL HIGH (ref 8.9–10.3)
Chloride: 107 mmol/L (ref 98–111)
Creatinine, Ser: 1.07 mg/dL (ref 0.61–1.24)
GFR, Estimated: >60 mL/min (ref >60)
Glucose, Bld: 121 mg/dL — ABNORMAL HIGH (ref 70–99)
Potassium: 4.3 mmol/L (ref 3.5–5.1)
Sodium: 141 mmol/L (ref 135–145)

## 2024-01-27 LAB — CBC
HCT: 46.3 % (ref 39.0–52.0)
Hemoglobin: 14.7 g/dL (ref 13.0–17.0)
MCH: 24.3 pg — ABNORMAL LOW (ref 26.0–34.0)
MCHC: 31.7 g/dL (ref 30.0–36.0)
MCV: 76.4 fL — ABNORMAL LOW (ref 80.0–100.0)
Platelets: 247 K/uL (ref 150–400)
RBC: 6.06 MIL/uL — ABNORMAL HIGH (ref 4.22–5.81)
RDW: 17 % — ABNORMAL HIGH (ref 11.5–15.5)
WBC: 8.3 K/uL (ref 4.0–10.5)
nRBC: 0 % (ref 0.0–0.2)

## 2024-01-27 LAB — TROPONIN T, HIGH SENSITIVITY
Troponin T High Sensitivity: <15 ng/L (ref 0–19)
Troponin T High Sensitivity: <15 ng/L (ref 0–19)

## 2024-01-27 MED ORDER — IOHEXOL 350 MG/ML SOLN
75.0000 mL | Freq: Once | INTRAVENOUS | Status: AC | PRN
  Administered 2024-01-27: 75 mL via INTRAVENOUS

## 2024-01-27 NOTE — ED Triage Notes (Addendum)
 Sudden onset of pain in left back under left scapula onset yesterday. PMH of aortic aneurysm. Pain radiates down left arm intermittently. Denies nausea and vomiting. Reports he is always SOB but none worse than normal.  Denies known injury.

## 2024-01-27 NOTE — ED Notes (Signed)
 Patient transported to X-ray

## 2024-01-27 NOTE — ED Notes (Signed)
 Patient transported to CT

## 2024-01-27 NOTE — ED Provider Notes (Signed)
 [facility] Provider Note   CSN: [id number] Arrival date & time: 01/27/24  1242     Patient presents with: Back Pain   Antonio Watts is a 63 y.o. male past medical history significant for nephrolithiasis, thoracic ascending aortic aneurysm, and hypertension presents today for sudden onset of left back pain underneath his left scapula that began yesterday.  Pain radiates down the left arm intermittently.  Patient states that the pain is not worse with movement and does not know of any injury but states that it does feel similar to whenever he has pinched nerves in his lower back previously.  Patient denies shortness of breath, numbness, weakness, nausea, vomiting, or any other complaints at this time.  Patient reports that he has surveillance of his aneurysm yearly and most recently had it done in April of this year.    Back Pain      Prior to Admission medications   Medication Sig Start Date End Date Taking? Authorizing Provider  amLODipine (NORVASC) 5 MG tablet Take 5 mg by mouth daily.    [provider]  aspirin EC 81 MG tablet Take 81 mg by mouth daily. Swallow whole.    [provider]  b complex vitamins tablet Take 1 tablet by mouth daily.    [provider]  cetirizine (ZYRTEC) 10 MG tablet Take 10 mg by mouth daily.    [provider]  escitalopram (LEXAPRO) 5 MG tablet Take 5 mg by mouth daily.    [provider]  gabapentin  (NEURONTIN ) 300 MG capsule TAKE 1 CAPSULE(300 MG) BY MOUTH THREE TIMES DAILY Patient taking differently: Take 300 mg by mouth at bedtime. 100 mg cap 03/12/21   Burchette, Wolm ORN, MD  lisinopril  (ZESTRIL ) 10 MG tablet TAKE 1 TABLET(10 MG) BY MOUTH DAILY Patient taking differently: Take 20 mg by mouth daily. 01/11/21   Burchette, Wolm ORN, MD  MELATONIN PO Take 12 mg by mouth at bedtime.    [provider]  meloxicam  (MOBIC ) 15 MG tablet TAKE 1 TABLET(15  MG) BY MOUTH DAILY 05/11/21   Janit Thresa CHRISTELLA, DPM  Multiple Vitamin (MULTIVITAMIN WITH MINERALS) TABS tablet Take 1 tablet by mouth daily.    [provider]  NON FORMULARY CPAP at bedtime    [provider]  ondansetron  (ZOFRAN -ODT) 8 MG disintegrating tablet Take 1 tablet (8 mg total) by mouth every 8 (eight) hours as needed for nausea. 06/28/22   Nanavati, Ankit, MD  phenazopyridine  (PYRIDIUM ) 200 MG tablet Take 1 tablet (200 mg total) by mouth 3 (three) times daily as needed for pain. 07/04/22   Cam Morene ORN, MD  rosuvastatin (CRESTOR) 10 MG tablet Take 10 mg by mouth at bedtime. 07/02/22   [provider]  traMADol (ULTRAM) 50 MG tablet Take 50 mg by mouth. 07/02/22   [provider]    Allergies: Doxycycline hyclate and Levofloxacin    Review of Systems  Musculoskeletal:  Positive for back pain.    Updated Vital Signs BP 120/68   Pulse (!) 56   Temp 98 F (36.7 C)   Resp 16   Ht 5' 9 (1.753 m)   Wt (!) 138.3 kg   SpO2 99%   BMI 45.04 kg/m   Physical Exam Vitals and nursing note reviewed.  Constitutional:      General: He is not in acute distress.    Appearance: He is well-developed. He is obese. He is not toxic-appearing.  HENT:  Head: Normocephalic and atraumatic.     Right Ear: External ear normal.     Left Ear: External ear normal.  Eyes:     Extraocular Movements: Extraocular movements intact.     Conjunctiva/sclera: Conjunctivae normal.  Cardiovascular:     Rate and Rhythm: Normal rate and regular rhythm.     Pulses: Normal pulses.     Heart sounds: Normal heart sounds. No murmur heard. Pulmonary:     Effort: Pulmonary effort is normal. No respiratory distress.     Breath sounds: Normal breath sounds.  Abdominal:     General: There is no distension.     Palpations: Abdomen is soft. There is no mass.     Tenderness: There is no abdominal tenderness.  Musculoskeletal:        General: No swelling or tenderness.      Cervical back: Neck supple.     Right lower leg: No edema.     Left lower leg: No edema.  Skin:    General: Skin is warm and dry.     Capillary Refill: Capillary refill takes less than 2 seconds.  Neurological:     General: No focal deficit present.     Mental Status: He is alert and oriented to person, place, and time.  Psychiatric:        Mood and Affect: Mood normal.     (all labs ordered are listed, but only abnormal results are displayed) Labs Reviewed  BASIC METABOLIC PANEL WITH GFR - Abnormal; Notable for the following components:      Result Value   Glucose, Bld 121 (*)    Calcium 10.4 (*)    All other components within normal limits  CBC - Abnormal; Notable for the following components:   RBC 6.06 (*)    MCV 76.4 (*)    MCH 24.3 (*)    RDW 17.0 (*)    All other components within normal limits  TROPONIN T, HIGH SENSITIVITY  TROPONIN T, HIGH SENSITIVITY    EKG: None  Radiology: CT Angio Chest Aorta W and/or Wo Contrast Addendum Date: 01/27/2024 ADDENDUM REPORT: 01/27/2024 16:19 ADDENDUM: The root of the aorta is 4 x 4.2 cm. It is not aneurysmal for a 63 year old patient with a BMI of 45. There is a tricuspid aortic valve with no valvular calcifications. Electronically Signed   By: Nancyann Burns M.D.   On: 01/27/2024 16:19   Result Date: 01/27/2024 CLINICAL DATA:  Sudden onset of left back and scapular pain EXAM: CT ANGIOGRAPHY CHEST WITH CONTRAST TECHNIQUE: Multidetector CT imaging of the chest was performed using the standard protocol during bolus administration of intravenous contrast. Multiplanar CT image reconstructions and MIPs were obtained to evaluate the vascular anatomy. RADIATION DOSE REDUCTION: This exam was performed according to the departmental dose-optimization program which includes automated exposure control, adjustment of the mA and/or kV according to patient size and/or use of iterative reconstruction technique. CONTRAST:  75mL OMNIPAQUE  IOHEXOL  350  MG/ML SOLN COMPARISON:  July 03, 2023 FINDINGS: Cardiovascular: The thoracic aorta is normal. No coronary calcifications. Mediastinum/Nodes: No hilar or mediastinal mass. Lungs/Pleura: Normal Upper Abdomen: No significant abnormality Musculoskeletal: No compression fracture identified IMPRESSION: No significant abnormality Electronically Signed: By: Nancyann Burns M.D. On: 01/27/2024 15:43   CT T-SPINE NO CHARGE Result Date: 01/27/2024 CLINICAL DATA:  Sudden onset of left back and subscapular pain EXAM: CT THORACIC SPINE WITHOUT CONTRAST TECHNIQUE: Multidetector CT images of the thoracic were obtained using the standard protocol without intravenous contrast. RADIATION  DOSE REDUCTION: This exam was performed according to the departmental dose-optimization program which includes automated exposure control, adjustment of the mA and/or kV according to patient size and/or use of iterative reconstruction technique. COMPARISON:  None Available. FINDINGS: Alignment: Normal Vertebrae: No compression fracture or bone lesion identified. Paraspinal and other soft tissues: No significant abnormality Disc levels: There is no disc herniation. There is degenerative disc disease at T9-10 with gas in the disc space. IMPRESSION: No compression fracture, bone lesion or other acute abnormality identified. Electronically Signed   By: Nancyann Burns M.D.   On: 01/27/2024 15:40   CT Cervical Spine Wo Contrast Result Date: 01/27/2024 CLINICAL DATA:  Sudden onset of left back and scapular pain EXAM: CT CERVICAL SPINE WITHOUT CONTRAST TECHNIQUE: Multidetector CT imaging of the cervical spine was performed without intravenous contrast. Multiplanar CT image reconstructions were also generated. RADIATION DOSE REDUCTION: This exam was performed according to the departmental dose-optimization program which includes automated exposure control, adjustment of the mA and/or kV according to patient size and/or use of iterative reconstruction  technique. COMPARISON:  None Available. FINDINGS: The craniocervical junction is normal. Atlantoaxial articulation is normal. No bone lesion identified. No significant soft tissue abnormality identified. C2-C3: Normal C3-C4: Normal C4-C5: Mild degenerative disc disease with mild disc bulge. Otherwise normal. C5-C6: There is moderate degenerative disc disease with a disc bulge and small bilateral foraminal spurs. There is moderate bilateral neural foraminal stenosis and mild spinal stenosis C6-C7: There is moderate degenerative disc disease. No significant facet disease. No bony spinal stenosis or foraminal stenosis C7-T1: There is moderate degenerative disc disease. No significant facet disease. No bony spinal stenosis or foraminal stenosis. IMPRESSION: Degenerative changes.  No acute abnormality. Electronically Signed   By: Nancyann Burns M.D.   On: 01/27/2024 15:38   DG Chest 2 View Result Date: 01/27/2024 EXAM: 2 VIEW(S) XRAY OF THE CHEST 01/27/2024 02:30:36 PM COMPARISON: 04/ 10 / 25 and x-ray 11 / 25 / 2011 CLINICAL HISTORY: CP FINDINGS: LUNGS AND PLEURA: No focal pulmonary opacity. No pulmonary edema. No pleural effusion. No pneumothorax. HEART AND MEDIASTINUM: No acute abnormality of the cardiac and mediastinal silhouettes. BONES AND SOFT TISSUES: No acute osseous abnormality. IMPRESSION: 1. No acute cardiopulmonary process. Electronically signed by: Norman Gatlin MD 01/27/2024 02:55 PM EST RP Workstation: [id number]     Procedures   Medications Ordered in the ED  iohexol  (OMNIPAQUE ) 350 MG/ML injection 75 mL (75 mLs Intravenous Contrast Given 01/27/24 1458)                                    Medical Decision Making Amount and/or Complexity of Data Reviewed Labs: ordered.   This patient presents to the ED for concern of subscapular pain, this involves an extensive number of treatment options, and is a complaint that carries with it a high risk of complications and morbidity.  The  differential diagnosis includes musculoskeletal pain, STEMI, NSTEMI, AAA, dissection, arrhythmia, anemia, electrolyte abnormality, pneumonia   Co morbidities / Chronic conditions that complicate the patient evaluation  Thoracic ascending aortic aneurysm, hypertension   Additional history obtained:  Additional history obtained from EMR External records from outside source obtained and reviewed including cardiothoracic notes   Lab Tests:  I Ordered, and personally interpreted labs.  The pertinent results include: CBC unremarkable, BMP with elevated calcium at 10.4, delta troponin less than 15   Imaging Studies ordered:  I ordered imaging studies  including chest x-ray I independently visualized and interpreted imaging which showed no acute cardiopulmonary process I agree with the radiologist interpretation CT C-spine Noncon which showed degenerative changes.  No acute abnormality CT L-spine which showed no compression fracture, bone lesion, or other acute abnormality identified CT angio chest aorta with and without which showed no significant abnormality   Cardiac Monitoring: / EKG:  The patient was maintained on a cardiac monitor.  I personally viewed and interpreted the cardiac monitored which showed an underlying rhythm of: Sinus rhythm, RAD   Test / Admission - Considered:  Consider for admission or further workup however patient's vital signs, physical exam, labs, and imaging are reassuring.  Patient's symptoms likely due to musculoskeletal pain or nerve impingement.  Patient advised to follow-up with orthopedics if his symptoms persist.  Patient given return precautions.  I feel patient is safe for discharge at this time.     Final diagnoses:  Pain of left scapula    ED Discharge Orders     None          Francis Ileana SAILOR, PA-C 01/27/24 1635    Lenor Hollering, MD 01/31/24 1102

## 2024-01-27 NOTE — Discharge Instructions (Signed)
 Today you were seen for left back/scapular pain.  I suspect this is likely musculoskeletal pain.  You may alternate Tylenol  Motrin  as needed for pain.  Please follow-up with orthopedics if your symptoms persist.  Please return to the ED if you have tearing chest pain, sudden shortness of breath, or worsening pain.  Thank you for letting us  treat you today. After reviewing your labs and imaging, I feel you are safe to go home. Please follow up with your PCP in the next several days and provide them with your records from this visit. Return to the Emergency Room if pain becomes severe or symptoms worsen.
